# Patient Record
Sex: Male | Born: 1985 | Race: White | Hispanic: No | Marital: Single | Smoking: Never smoker
Health system: Southern US, Community
[De-identification: ages and names within clinical notes are randomized; demographics above are authoritative.]

## PROBLEM LIST (undated history)

## (undated) DIAGNOSIS — Z8489 Family history of other specified conditions: Secondary | ICD-10-CM

## (undated) DIAGNOSIS — F419 Anxiety disorder, unspecified: Secondary | ICD-10-CM

## (undated) DIAGNOSIS — G4730 Sleep apnea, unspecified: Secondary | ICD-10-CM

## (undated) DIAGNOSIS — I10 Essential (primary) hypertension: Secondary | ICD-10-CM

## (undated) HISTORY — PX: CHOLECYSTECTOMY: SHX55

---

## 2009-11-07 HISTORY — PX: LAPAROSCOPIC GASTRIC BANDING: SHX1100

## 2009-12-14 ENCOUNTER — Ambulatory Visit (HOSPITAL_COMMUNITY): Admission: RE | Admit: 2009-12-14 | Discharge: 2009-12-14 | Payer: Self-pay | Admitting: Surgery

## 2009-12-16 ENCOUNTER — Ambulatory Visit (HOSPITAL_COMMUNITY): Admission: RE | Admit: 2009-12-16 | Discharge: 2009-12-16 | Payer: Self-pay | Admitting: Surgery

## 2010-01-18 ENCOUNTER — Encounter: Admission: RE | Admit: 2010-01-18 | Discharge: 2010-04-18 | Payer: Self-pay | Admitting: Surgery

## 2010-03-08 ENCOUNTER — Ambulatory Visit (HOSPITAL_COMMUNITY): Admission: RE | Admit: 2010-03-08 | Discharge: 2010-03-09 | Payer: Self-pay | Admitting: Surgery

## 2010-04-29 ENCOUNTER — Encounter: Admission: RE | Admit: 2010-04-29 | Discharge: 2010-04-29 | Payer: Self-pay | Admitting: Surgery

## 2011-01-25 LAB — CBC
HCT: 40.9 % (ref 39.0–52.0)
HCT: 45 % (ref 39.0–52.0)
MCHC: 34.2 g/dL (ref 30.0–36.0)
MCV: 86.3 fL (ref 78.0–100.0)
Platelets: 245 10*3/uL (ref 150–400)
RBC: 4.7 MIL/uL (ref 4.22–5.81)
WBC: 11.6 10*3/uL — ABNORMAL HIGH (ref 4.0–10.5)

## 2011-01-25 LAB — DIFFERENTIAL
Basophils Absolute: 0 10*3/uL (ref 0.0–0.1)
Basophils Absolute: 0.1 10*3/uL (ref 0.0–0.1)
Basophils Relative: 1 % (ref 0–1)
Eosinophils Relative: 0 % (ref 0–5)
Lymphs Abs: 1.5 10*3/uL (ref 0.7–4.0)
Monocytes Absolute: 0.8 10*3/uL (ref 0.1–1.0)
Neutrophils Relative %: 58 % (ref 43–77)

## 2011-01-25 LAB — COMPREHENSIVE METABOLIC PANEL
ALT: 71 U/L — ABNORMAL HIGH (ref 0–53)
AST: 35 U/L (ref 0–37)
BUN: 14 mg/dL (ref 6–23)
Calcium: 9.3 mg/dL (ref 8.4–10.5)
Chloride: 103 mEq/L (ref 96–112)
GFR calc Af Amer: >60 mL/min (ref >60)
GFR calc non Af Amer: >60 mL/min (ref >60)
Potassium: 4.1 mEq/L (ref 3.5–5.1)

## 2011-03-14 ENCOUNTER — Other Ambulatory Visit (HOSPITAL_COMMUNITY): Payer: Self-pay

## 2011-03-14 ENCOUNTER — Emergency Department (HOSPITAL_COMMUNITY)
Admission: EM | Admit: 2011-03-14 | Discharge: 2011-03-14 | Disposition: A | Discharge status: Home / Self Care | Payer: BC Managed Care – PPO | Attending: Emergency Medicine | Admitting: Emergency Medicine

## 2011-03-14 ENCOUNTER — Emergency Department (HOSPITAL_COMMUNITY): Payer: BC Managed Care – PPO

## 2011-03-14 DIAGNOSIS — K8020 Calculus of gallbladder without cholecystitis without obstruction: Secondary | ICD-10-CM | POA: Insufficient documentation

## 2011-03-14 DIAGNOSIS — Z9884 Bariatric surgery status: Secondary | ICD-10-CM | POA: Insufficient documentation

## 2011-03-14 DIAGNOSIS — R748 Abnormal levels of other serum enzymes: Secondary | ICD-10-CM | POA: Insufficient documentation

## 2011-03-14 DIAGNOSIS — R109 Unspecified abdominal pain: Secondary | ICD-10-CM | POA: Insufficient documentation

## 2011-03-14 DIAGNOSIS — E669 Obesity, unspecified: Secondary | ICD-10-CM | POA: Insufficient documentation

## 2011-03-14 LAB — CBC
HCT: 44.8 % (ref 39.0–52.0)
Hemoglobin: 15.9 g/dL (ref 13.0–17.0)
MCHC: 35.5 g/dL (ref 30.0–36.0)
Platelets: 240 10*3/uL (ref 150–400)
RDW: 13.1 % (ref 11.5–15.5)

## 2011-03-14 LAB — URINALYSIS, ROUTINE W REFLEX MICROSCOPIC
Hgb urine dipstick: NEGATIVE
Protein, ur: NEGATIVE mg/dL
Urobilinogen, UA: 1 mg/dL (ref 0.0–1.0)

## 2011-03-14 LAB — LIPID PANEL
HDL: 32 mg/dL — ABNORMAL LOW (ref >39)
Total CHOL/HDL Ratio: 5.3 RATIO

## 2011-03-14 LAB — COMPREHENSIVE METABOLIC PANEL
AST: 253 U/L — ABNORMAL HIGH (ref 0–37)
Albumin: 4.2 g/dL (ref 3.5–5.2)
Calcium: 9.6 mg/dL (ref 8.4–10.5)
Creatinine, Ser: 0.85 mg/dL (ref 0.4–1.5)
GFR calc non Af Amer: >60 mL/min (ref >60)

## 2011-03-14 LAB — DIFFERENTIAL
Lymphocytes Relative: 20 % (ref 12–46)
Monocytes Absolute: 0.6 10*3/uL (ref 0.1–1.0)
Neutro Abs: 7 10*3/uL (ref 1.7–7.7)

## 2011-03-14 LAB — URINE MICROSCOPIC-ADD ON

## 2011-03-18 NOTE — Consult Note (Signed)
  NAMEDEREKE, NEUMANN                ACCOUNT NO.:  [id number]  MEDICAL RECORD NO.:  [id number]           PATIENT TYPE:  E  LOCATION:  WLED                         FACILITY:  [facility]  PHYSICIAN:  Thornton Park. Daphine Deutscher, MD  DATE OF BIRTH:  12/13/1985  DATE OF CONSULTATION: DATE OF DISCHARGE:  03/14/2011                                CONSULTATION   CHIEF COMPLAINT:  Cramping right upper quadrant abdominal pain with scleral icterus.  HISTORY:  This is a 25 year old white male who lost in excess of 150 pounds with lap band.  He looks good from that standpoint but last couple of days has developed some right upper quadrant abdominal pain. He was seen in the office by me where I removed 3 mL from his band and send him to the emergency department.  My impression he had scleral icterus with the right upper quadrant pain. It as not related to his band. It was likely his gallbladder.  In the ED, his blood pressure 152/84, pulse 75, respirations 16 and afebrile.  Gallbladder ultrasound which I ordered showed a tiny dependent shadowing calculi within the gallbladder.  No evidence of acute cholecystitis and that there was no wall thickening, pericholecystic fluid.  Common bile duct measured 4 mm so was not suggestive of distal common duct stone. White count 9800, hemoglobin 15.9.  Laboratory was abnormal and his SGOT was 253 and his total bilirubin was 5.4.  PLAN:  The plan as discussed with parents and with him is let him go home and to drink more fluids to increase his hydration.  Look for evidence to pass the stone as evidenced by his urine so concentrated and dark and stools having some color, and if this does not turn around in the next 24 hours likely the patient may need GI evaluation with ERCP and discussed timing of lap chole.  He has supposed to have an exam from our.  Currently he is feeling better and wants to go home, so we will go with that plan.  Check with our office in the  morning.  I gave him copies of his labs to share with Dr. Purnell Shoemaker tomorrow.  IMPRESSION AND PLAN:  Biliary colic from small gallstone passing of the common bile duct.     Thornton Park Daphine Deutscher, MD     MBM/MEDQ  D:  03/14/2011  T:  03/15/2011  Job:  [id number]  Electronically Signed by Luretha Murphy MD on 03/18/2011 08:36:11 AM

## 2011-04-25 IMAGING — CR DG CHEST 2V
2 series · 2 of 2 positions shown · non-contrast
Comparison: None

CLINICAL DATA: Preop for bariatric surgery

CHEST - 2 VIEW

[w chest pa]
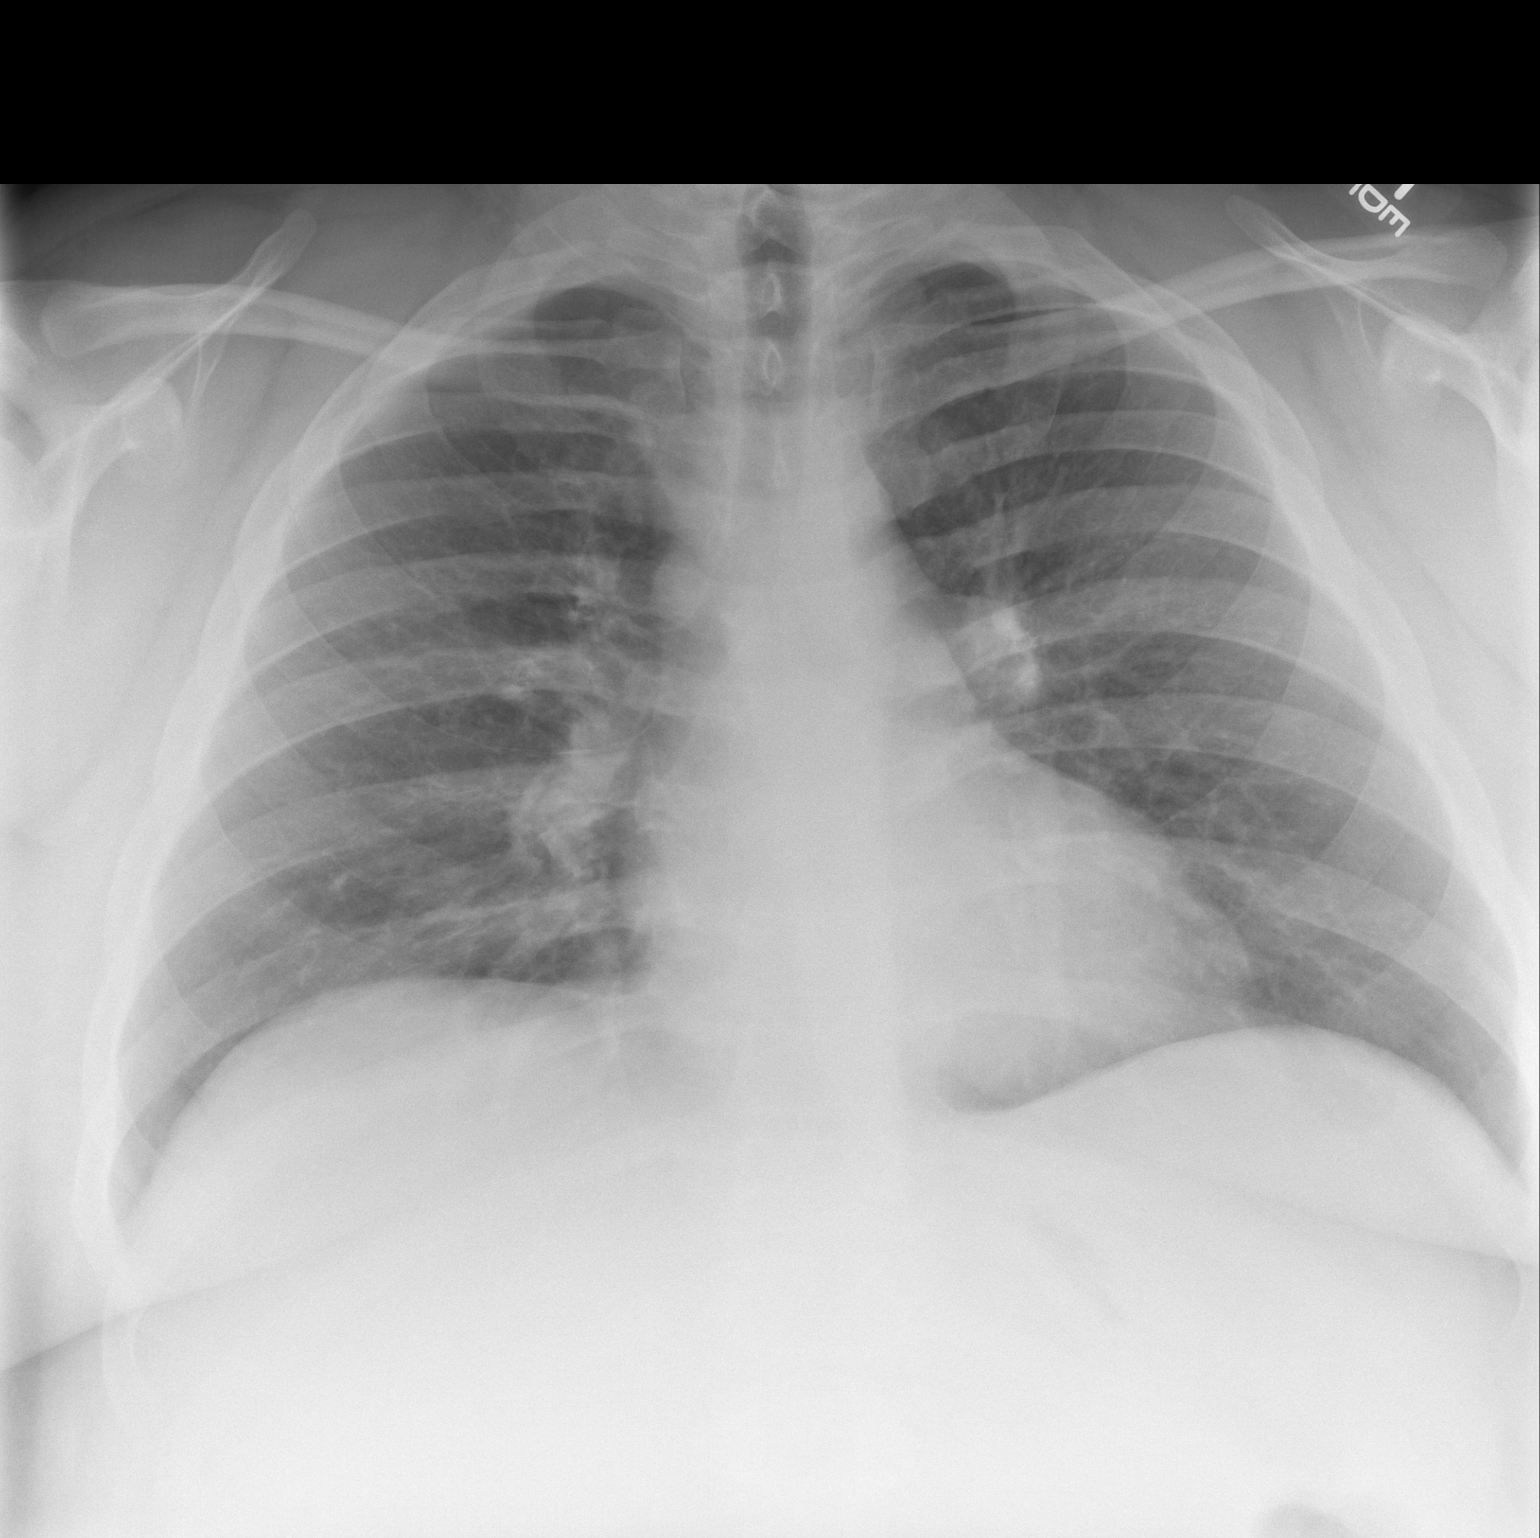

[w chest lat *]
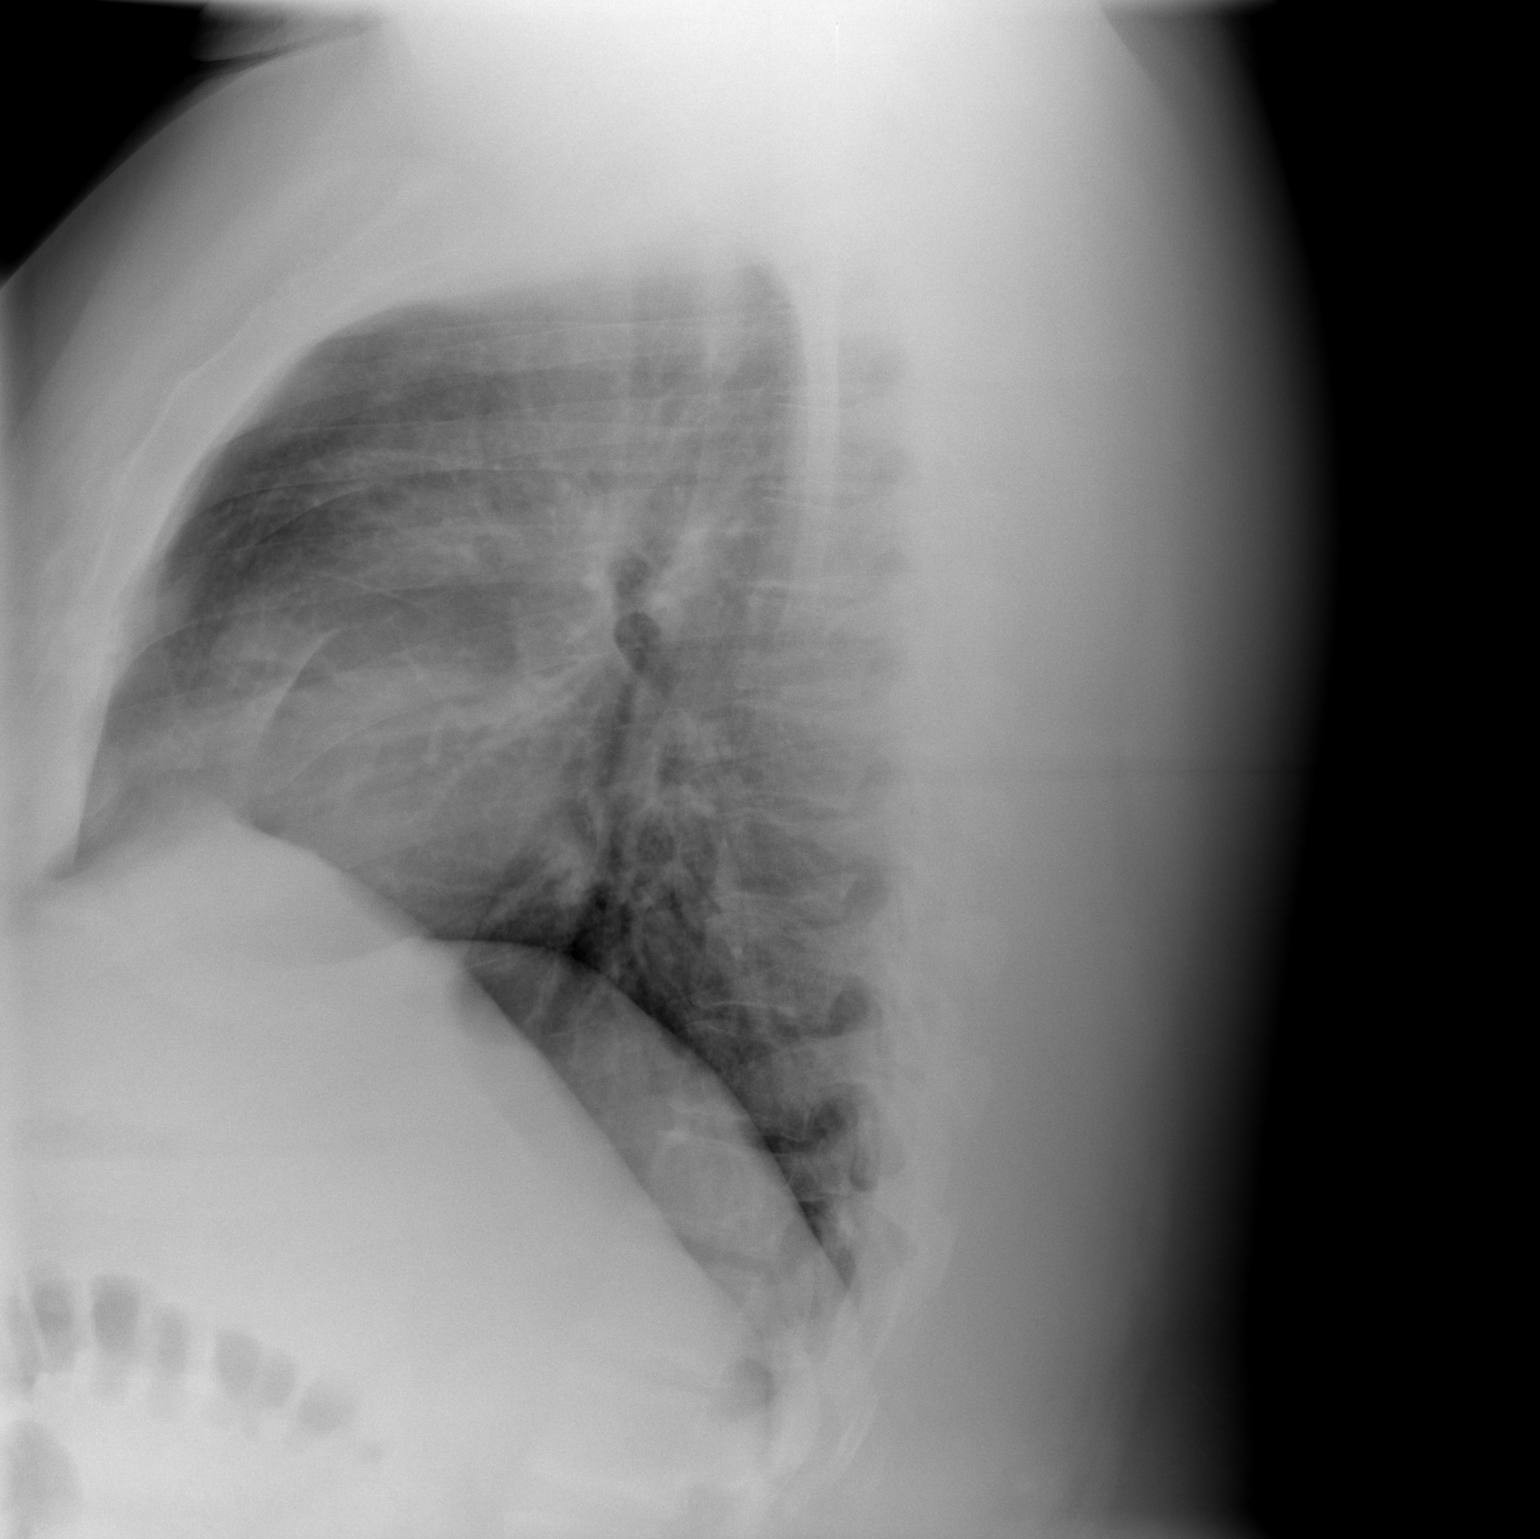

[2 of 2 positions shown; findings below may reference images not displayed]

FINDINGS: Heart and mediastinal contours normal.  Lungs clear.  No
pleural fluid or osseous lesions.
IMPRESSION: No active disease.

## 2011-05-19 ENCOUNTER — Other Ambulatory Visit (INDEPENDENT_AMBULATORY_CARE_PROVIDER_SITE_OTHER): Payer: Self-pay | Admitting: Surgery

## 2011-05-19 ENCOUNTER — Encounter (HOSPITAL_COMMUNITY): Payer: BC Managed Care – PPO

## 2011-05-25 ENCOUNTER — Other Ambulatory Visit (INDEPENDENT_AMBULATORY_CARE_PROVIDER_SITE_OTHER): Payer: Self-pay | Admitting: Surgery

## 2011-05-25 ENCOUNTER — Ambulatory Visit (HOSPITAL_COMMUNITY): Payer: BC Managed Care – PPO

## 2011-05-25 ENCOUNTER — Ambulatory Visit (HOSPITAL_COMMUNITY)
Admission: RE | Admit: 2011-05-25 | Discharge: 2011-05-26 | Disposition: A | Discharge status: Home / Self Care | Payer: BC Managed Care – PPO | Source: Ambulatory Visit | Attending: Surgery | Admitting: Surgery

## 2011-05-25 DIAGNOSIS — Z01812 Encounter for preprocedural laboratory examination: Secondary | ICD-10-CM | POA: Insufficient documentation

## 2011-05-25 DIAGNOSIS — K8010 Calculus of gallbladder with chronic cholecystitis without obstruction: Secondary | ICD-10-CM

## 2011-05-26 LAB — COMPREHENSIVE METABOLIC PANEL
ALT: 69 U/L — ABNORMAL HIGH (ref 0–53)
AST: 29 U/L (ref 0–37)
Alkaline Phosphatase: 63 U/L (ref 39–117)
Calcium: 8.9 mg/dL (ref 8.4–10.5)
Creatinine, Ser: 0.76 mg/dL (ref 0.50–1.35)
Potassium: 3.5 mEq/L (ref 3.5–5.1)
Total Bilirubin: 0.7 mg/dL (ref 0.3–1.2)
Total Protein: 6.5 g/dL (ref 6.0–8.3)

## 2011-05-26 LAB — CBC
HCT: 42.4 % (ref 39.0–52.0)
MCH: 29.5 pg (ref 26.0–34.0)
MCHC: 34.2 g/dL (ref 30.0–36.0)
MCV: 86.2 fL (ref 78.0–100.0)
WBC: 13.6 10*3/uL — ABNORMAL HIGH (ref 4.0–10.5)

## 2011-05-26 NOTE — Op Note (Signed)
  NAMEJAIRON, RIPBERGER NO.:  [id number]  MEDICAL RECORD NO.:  [id number]  LOCATION:  DAYL                         FACILITY:  [facility]  PHYSICIAN:  Thornton Park. Daphine Deutscher, MD  DATE OF BIRTH:  [birth year]  DATE OF PROCEDURE:  05/25/2011 DATE OF DISCHARGE:                              OPERATIVE REPORT   PREOPERATIVE DIAGNOSIS:  Chronic cholecystitis with symptoms, suggestive of choledocholithiasis.  PROCEDURE:  Lap cholecystectomy.  FINDINGS:  Cystic duct obstruction, presumably with stones and inflammatory action.  SURGEON:  Thornton Park. Daphine Deutscher, MD.  ASSISTANT:  Ollen Gross. Vernell Morgans, M.D.  ANESTHESIA:  General endotracheal.  DESCRIPTION OF PROCEDURE:  [place]ale Marney was taken to room on Wednesday, May 25, 2011, given general anesthesia.  He is about 14 months out from a lap band and has done well and lost over 100 pounds.  In the last several weeks, he has developed biliary colic-type symptoms, had small gallstones, seen on an ultrasound.  After he was placed asleep, the abdomen was prepped with PC max and draped sterilely.  I entered the abdomen without difficulty through the right upper quadrant using a 5-mm 0-degrees OptiVu technique.  Once the abdomen was insufflated, a five was placed below the umbilicus and 11 was placed in the upper abdomen, another five was placed laterally.  The gallbladder was readily visualized, it was large.  We went ahead and began mobilization, the neck was very stuck with omentum and fat.  This was peeled away and I did a dissection into the funnel of the gallbladder, I am heading toward, appeared to be a cystic duct.  It was broad-based.  I dissected free of vessels and put a clip upon the gallbladder and incised this, and then inserted a Reddick catheter.  I took a dynamic cholangiogram, I am initially thinking that it had become dislodged because it was filled and then extravasated.  As a layer, reviewed these and again after I  repeated it, looked like I was filling the final of the distal infundibulum or cystic duct junction.  The cystic duct appeared to probably be intrahepatic with a common or antrum, common duct showed common wall because further dissection was appeared to be hazardous.  I elected at that point to, I probed it with the Reddick catheter, was unable to dislodge any stones and elected to double-ligated with PDS tying, lap were tied.  The gallbladder was then removed, triply clipped the cystic artery, and then placed the gallbladder in bag, and brought out through the upper port.  Inspection of the gallbladder planned, no bleeding or bile leaks were noted.  Port sites were all injected with Exparel and then were closed with 4-0 Vicryl.  Benzoin, Steri-Strips applied.  The patient tolerated the procedure well, was taken to recovery room in satisfactory condition.     Thornton Park Daphine Deutscher, MD     MBM/MEDQ  D:  05/25/2011  T:  05/25/2011  Job:  [id number]  Electronically Signed by Luretha Murphy MD on 05/26/2011 08:46:56 AM

## 2011-05-29 NOTE — Discharge Summary (Signed)
  NAMEMERWYN, HODAPP NO.:  [id number]  MEDICAL RECORD NO.:  [id number]  LOCATION:  1536                         FACILITY:  [facility]  PHYSICIAN:  Thornton Park. Daphine Deutscher, MD  DATE OF BIRTH:  Nov 08, 1985  DATE OF ADMISSION:  05/25/2011 DATE OF DISCHARGE:  05/26/2011                              DISCHARGE SUMMARY   ADMITTING DIAGNOSIS:  Chronic cholecystitis.  DISCHARGE DIAGNOSIS:  Chronic cholecystitis.  COURSE IN HOSPITAL:  This 25 year old white male 1 year out from lap band losing about 100 pounds developed symptomatic gallstones.  These were impacted in the cystic duct.  This precluded the completion intraoperative cholangiogram.  He had a significant inflammatory reaction around his infundibulum of his gallbladder explaining his pain. He was doing well and ready for discharge on postop day #1.  Labs were all considered appropriate and normal considering surgery. Followup in our office in 2 weeks.  CONDITION:  Improved.     Thornton Park Daphine Deutscher, MD     MBM/MEDQ  D:  05/26/2011  T:  05/26/2011  Job:  [id number]  Electronically Signed by Luretha Murphy MD on 05/29/2011 10:43:17 PM

## 2011-05-30 ENCOUNTER — Telehealth (INDEPENDENT_AMBULATORY_CARE_PROVIDER_SITE_OTHER): Payer: Self-pay | Admitting: General Surgery

## 2011-05-30 ENCOUNTER — Other Ambulatory Visit (INDEPENDENT_AMBULATORY_CARE_PROVIDER_SITE_OTHER): Payer: Self-pay | Admitting: Surgery

## 2011-05-30 LAB — CBC WITH DIFFERENTIAL/PLATELET
Hemoglobin: 16 g/dL (ref 13.0–17.0)
Lymphocytes Relative: 28 % (ref 12–46)
Lymphs Abs: 3.1 10*3/uL (ref 0.7–4.0)
MCH: 31.4 pg (ref 26.0–34.0)
MCHC: 35.6 g/dL (ref 30.0–36.0)
Monocytes Relative: 4 % (ref 3–12)
Platelets: 265 10*3/uL (ref 150–400)
RBC: 5.09 MIL/uL (ref 4.22–5.81)
RDW: 12.5 % (ref 11.5–15.5)

## 2011-05-30 LAB — HEPATIC FUNCTION PANEL
ALT: 53 U/L (ref 0–53)
Alkaline Phosphatase: 59 U/L (ref 39–117)
Bilirubin, Direct: 0.2 mg/dL (ref 0.0–0.3)
Total Bilirubin: 0.9 mg/dL (ref 0.3–1.2)

## 2011-05-30 LAB — COMPREHENSIVE METABOLIC PANEL
ALT: 53 U/L (ref 0–53)
AST: 27 U/L (ref 0–37)
Albumin: 4.2 g/dL (ref 3.5–5.2)
Alkaline Phosphatase: 60 U/L (ref 39–117)
Calcium: 9.5 mg/dL (ref 8.4–10.5)
Total Bilirubin: 0.9 mg/dL (ref 0.3–1.2)

## 2011-05-30 NOTE — Telephone Encounter (Signed)
PT'S MOTHER CALLED TO REPORT THAT ZEB DOES NOT FEEL WELL/ NAUSEA, DIARRHEA 2-3 TIMES A DAY AND COUGH. NO FEVER AND PAIN IS BETTER COMPARED TO 2-3 DAYS AGO. PAIN MED IS CONTROLLING/ DR. MARTIN NOTIFIED AND ORDERED LABS TO BE DONE TODAY/ STAT/ LIVER PROFILE, CMET AND CBC WITH DIFF. PT'S MOTHER NOTIFIED.

## 2011-05-30 NOTE — Telephone Encounter (Signed)
CBC and CMP ok per [employer].  Pt feeling better per RN Dellie Catholic).  Will f/u soon.

## 2011-05-31 ENCOUNTER — Telehealth (INDEPENDENT_AMBULATORY_CARE_PROVIDER_SITE_OTHER): Payer: Self-pay | Admitting: Surgery

## 2011-06-15 ENCOUNTER — Encounter (INDEPENDENT_AMBULATORY_CARE_PROVIDER_SITE_OTHER): Payer: Self-pay | Admitting: General Surgery

## 2011-06-16 ENCOUNTER — Ambulatory Visit (INDEPENDENT_AMBULATORY_CARE_PROVIDER_SITE_OTHER): Payer: BC Managed Care – PPO | Admitting: Surgery

## 2011-06-16 ENCOUNTER — Encounter (INDEPENDENT_AMBULATORY_CARE_PROVIDER_SITE_OTHER): Payer: Self-pay | Admitting: Surgery

## 2011-06-16 VITALS — Temp 97.2°F | Wt 313.4 lb

## 2011-06-16 DIAGNOSIS — Z9049 Acquired absence of other specified parts of digestive tract: Secondary | ICD-10-CM

## 2011-06-16 DIAGNOSIS — Z9889 Other specified postprocedural states: Secondary | ICD-10-CM

## 2011-06-16 NOTE — Progress Notes (Signed)
Manuel Wood comes in today after his laparoscopic cholecystectomy. His incisions have healed nicely. Last 3 days he has had some form GI bug which resolved yesterday. His weight is down to 313 which is what it was back when I took fluid from his band in May.  I will see him again in 6 weeks in followup and consider band fill at that time.

## 2011-06-19 ENCOUNTER — Emergency Department (HOSPITAL_COMMUNITY): Payer: BC Managed Care – PPO

## 2011-06-19 ENCOUNTER — Inpatient Hospital Stay (HOSPITAL_COMMUNITY)
Admission: EM | Admit: 2011-06-19 | Discharge: 2011-06-21 | DRG: 208 | Disposition: A | Discharge status: Home / Self Care | Payer: BC Managed Care – PPO | Attending: Surgery | Admitting: Surgery

## 2011-06-19 DIAGNOSIS — Z9884 Bariatric surgery status: Secondary | ICD-10-CM

## 2011-06-19 DIAGNOSIS — R112 Nausea with vomiting, unspecified: Secondary | ICD-10-CM | POA: Diagnosis present

## 2011-06-19 DIAGNOSIS — K838 Other specified diseases of biliary tract: Principal | ICD-10-CM | POA: Diagnosis present

## 2011-06-19 DIAGNOSIS — E669 Obesity, unspecified: Secondary | ICD-10-CM | POA: Diagnosis present

## 2011-06-19 LAB — COMPREHENSIVE METABOLIC PANEL
ALT: 416 U/L — ABNORMAL HIGH (ref 0–53)
AST: 303 U/L — ABNORMAL HIGH (ref 0–37)
Alkaline Phosphatase: 152 U/L — ABNORMAL HIGH (ref 39–117)
CO2: 30 mEq/L (ref 19–32)
Calcium: 10.1 mg/dL (ref 8.4–10.5)
Potassium: 3.6 mEq/L (ref 3.5–5.1)
Sodium: 137 mEq/L (ref 135–145)
Total Protein: 8.2 g/dL (ref 6.0–8.3)

## 2011-06-19 LAB — URINALYSIS, ROUTINE W REFLEX MICROSCOPIC
Glucose, UA: NEGATIVE mg/dL
Hgb urine dipstick: NEGATIVE
Protein, ur: NEGATIVE mg/dL
Urobilinogen, UA: 1 mg/dL (ref 0.0–1.0)

## 2011-06-19 LAB — CBC
HCT: 47.7 % (ref 39.0–52.0)
Hemoglobin: 16.8 g/dL (ref 13.0–17.0)
MCHC: 35.2 g/dL (ref 30.0–36.0)
MCV: 85.3 fL (ref 78.0–100.0)
RDW: 12.9 % (ref 11.5–15.5)

## 2011-06-19 LAB — URINE MICROSCOPIC-ADD ON

## 2011-06-19 LAB — DIFFERENTIAL
Basophils Absolute: 0 10*3/uL (ref 0.0–0.1)
Eosinophils Relative: 2 % (ref 0–5)
Lymphocytes Relative: 23 % (ref 12–46)
Lymphs Abs: 2.3 10*3/uL (ref 0.7–4.0)
Monocytes Absolute: 0.8 10*3/uL (ref 0.1–1.0)
Monocytes Relative: 8 % (ref 3–12)
Neutro Abs: 6.7 10*3/uL (ref 1.7–7.7)

## 2011-06-20 LAB — CBC
Hemoglobin: 14.5 g/dL (ref 13.0–17.0)
MCH: 29.5 pg (ref 26.0–34.0)
MCHC: 34.5 g/dL (ref 30.0–36.0)
MCV: 85.5 fL (ref 78.0–100.0)
RBC: 4.91 MIL/uL (ref 4.22–5.81)

## 2011-06-20 LAB — LIPASE, BLOOD: Lipase: 17 U/L (ref 11–59)

## 2011-06-20 LAB — COMPREHENSIVE METABOLIC PANEL
BUN: 9 mg/dL (ref 6–23)
CO2: 28 mEq/L (ref 19–32)
Calcium: 9 mg/dL (ref 8.4–10.5)
Creatinine, Ser: 0.81 mg/dL (ref 0.50–1.35)
GFR calc Af Amer: >60 mL/min (ref >60)
GFR calc non Af Amer: >60 mL/min (ref >60)
Glucose, Bld: 112 mg/dL — ABNORMAL HIGH (ref 70–99)

## 2011-06-21 ENCOUNTER — Inpatient Hospital Stay (HOSPITAL_COMMUNITY): Payer: BC Managed Care – PPO

## 2011-06-21 LAB — COMPREHENSIVE METABOLIC PANEL
Albumin: 3.2 g/dL — ABNORMAL LOW (ref 3.5–5.2)
Alkaline Phosphatase: 120 U/L — ABNORMAL HIGH (ref 39–117)
BUN: 5 mg/dL — ABNORMAL LOW (ref 6–23)
Potassium: 3.9 mEq/L (ref 3.5–5.1)
Sodium: 139 mEq/L (ref 135–145)
Total Protein: 6.3 g/dL (ref 6.0–8.3)

## 2011-06-21 LAB — PROTIME-INR
INR: 1.04 (ref 0.00–1.49)
Prothrombin Time: 13.8 seconds (ref 11.6–15.2)

## 2011-06-21 LAB — CBC
MCH: 30.5 pg (ref 26.0–34.0)
Platelets: 216 10*3/uL (ref 150–400)
RBC: 4.65 MIL/uL (ref 4.22–5.81)
WBC: 9.4 10*3/uL (ref 4.0–10.5)

## 2011-06-21 LAB — TYPE AND SCREEN
ABO/RH(D): O POS
Antibody Screen: NEGATIVE

## 2011-06-21 LAB — ABO/RH: ABO/RH(D): O POS

## 2011-06-21 NOTE — Consult Note (Signed)
Manuel Wood, Manuel Wood NO.:  [id number]  MEDICAL RECORD NO.:  [id number]  LOCATION:  1529                         FACILITY:  [facility]  PHYSICIAN:  Bernette Redbird, M.D.   DATE OF BIRTH:  04/30/1986  DATE OF CONSULTATION: DATE OF DISCHARGE:                                CONSULTATION   Dr. Johna Sheriff of the surgical service, covering for Dr. Luretha Murphy, asked Manuel Wood to see this 25 year old gentleman because of probable choledocholithiasis.  Manuel Wood is approximately 1 month status post laparoscopic cholecystectomy, at which time his liver chemistries were essentially normal with just an elevation of ALT  to 69, a 4 mm common duct, but inability to enter the cystic duct during attempted intraoperative cholangiography, raising the question of some impacted stones there. The patient did well until about a week ago when he began having pain reminiscent of his gallbladder attack with abdominal pain, nausea, vomiting, headache, and pain going through to his lower back.  He did not have fevers or chills, but did develop dark urine.  He presented to the emergency room last night, where his white count was normal, and lipase was normal, but there had been a significant interim rise in his liver chemistries, with transaminases in the 300-400 range and a bilirubin of 3.6.  He was admitted with a presumptive diagnosis of choledocholithiasis.  The patient has been in good general health apart from significant obesity, for which he underwent Lap-Band surgery about a year and half ago, with a loss of about 100 pounds in weight.  The Lap-Band was loosened last night by removing fluid from the reservoir in anticipation of the need for ERCP.  That was done last night in the emergency room by Dr. Johna Sheriff.  At this time, the patient is fairly comfortable, and his vital signs have been unremarkable in the hospital.  PAST MEDICAL HISTORY:  No known allergies.  OUTPATIENT  MEDICATIONS:  None.  PRIOR OPERATIONS:  Per HPI, Lap-Band and laparoscopic cholecystectomy.  CHRONIC MEDICAL ILLNESSES:  None apart from the above-mentioned significant obesity.  Specifically, no coronary disease, hypertension, diabetes, asthma, or ulcers.  HABITS:  Nonsmoker, nondrinker.  FAMILY HISTORY:  Not obtained.  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  REVIEW OF SYSTEMS:  See HPI.  PHYSICAL EXAMINATION:  GENERAL:  This is an extremely pleasant, articulate, Caucasian male in absolutely no distress, lying in bed, but sitting up easily. HEENT:  He is anicteric and without pallor. SKIN:  Warm and dry. CHEST:  Clear. HEART:  Without gallops, rubs, or murmurs appreciated. ABDOMEN:  Still substantially adipose, but without tenderness to palpation.  LABORATORY DATA:  White count 7700, hemoglobin 14.5, and platelets 225,000.  Chemistry panel shows normal renal function, glucose of 112. Liver chemistries have risen slightly from last night.  Bilirubin has gone from 3.6 to 4.3, AST from 303 to 342, and ALT from 416 to 444. There has been a slight drop in his alk phos from 152 to 126.  Lipase is normal at 17.  Urinalysis shows moderate bilirubin.  IMPRESSION:  The clinical picture strongly suggestive of choledocholithiasis without complications such as cholangitis or pancreatitis.  He is symptomatic consistent with common duct obstruction, with both pain and elevation of liver chemistries.  RECOMMENDATIONS:  We agree with the impression of choledocholithiasis and feel that the patient should undergo ERCP.  This has been arranged for 7:30 tomorrow morning under anesthesia.  We feel that anesthesia rather than standard IV conscious sedation would be better given this patient's young age and large body size.  I have reviewed the nature, purpose,  risks, and alternatives of ERCP, sphincterotomy, stone extraction, and stent placement with the patient and his mother, and they are desirous of proceeding.  As noted, the patient's Lap-Band has been loosened already, so there should be no impediment to endoscopic evaluation for the ERCP.  I have discussed the case with my partner, Dr. Vida Rigger, who will be the one most likely to do the procedure in the morning, and he and I agree that further imaging studies at this time such as an updated ultrasound would not likely add anything to the patient's care, so we will not pursue them at this point.  We certainly appreciate the opportunity to have seen this very pleasant patient in consultation with you.          ______________________________ Bernette Redbird, M.D.     RB/MEDQ  D:  06/20/2011  T:  06/20/2011  Job:  [id number]  cc:   Thornton Park Daphine Deutscher, MD [address], [address] [place] [address]  Electronically Signed by Bernette Redbird M.D. on 06/21/2011 10:58:26 AM

## 2011-07-04 NOTE — H&P (Signed)
Manuel Wood, Manuel Wood NO.:  [id number]  MEDICAL RECORD NO.:  [id number]  LOCATION:  WLED                         FACILITY:  [facility]  PHYSICIAN:  Sharlet Salina T. Juni Glaab, M.D.DATE OF BIRTH:  13-Aug-1986  DATE OF ADMISSION:  06/19/2011 DATE OF DISCHARGE:                             HISTORY & PHYSICAL   CHIEF COMPLAINT:  Nausea, vomiting, abdominal pain.  HISTORY:  Manuel Wood is a 25 year old male with a significant history of lap band placement about one and a half years ago by Dr. Daphine Deutscher over 100 pound weight loss.  He developed abdominal pain, was found to have gallstones, and underwent laparoscopic cholecystectomy by Dr. Daphine Deutscher on May 25, 2011.  Cholangiogram was not technically possible and there was concern about a stone at the cystic duct, common duct junction.  The patient, however, did well postoperatively.  He did have 1 episode of recurrent abdominal pain in his right upper quadrant a week or two postop and lab work was unremarkable and this resolved.  The patient now, however, presents to [facility] this weekend with 3 days of persistent epigastric abdominal pain, aching in character, and also pain in his midback.  This has been associated with nausea and vomiting of anything he has tried to eat.  The pain is moderately severe.  It is better after medication here in the emergency room.  It is also noted that his urine has been dark.  No fever or chills.  He does not really have symptoms of obstruction secondary to his band.  PAST MEDICAL HISTORY:  Other than above is entirely unremarkable.  MEDICATIONS:  None.  ALLERGIES:  None.  SOCIAL HISTORY:  Works as a Economist.  Does not smoke cigarettes or drink alcohol or use drugs.  FAMILY HISTORY:  Noncontributory.  REVIEW OF SYSTEMS:  GENERAL:  No fever, chills.  HEENT:  No vision, hearing, swallowing problems.  RESPIRATORY:  No shortness of breath, cough, wheezing.   CARDIAC:  No chest pain, palpitations, history heart disease.  GI:  Above.  GU:  No urinary burning, frequency, or blood.  PHYSICAL EXAMINATION:  VITAL SIGNS:  Temperature is 98, heart rate 20, blood pressure is 161/99, heart rate is 80, respirations 20, O2 saturation 100%.  GENERAL:  Overweight white male in no acute distress. SKIN:  Warm and dry.  No rash or infection. HEENT:  Scleral icterus not appreciable.  Pupils equal, round, and reactive.  Oropharynx clear. LYMPH NODES:  No cervical, supraclavicular, or inguinal nodes palpable. LUNGS:  Clear without wheezing or increased work of breathing. CARDIAC:  Regular rate and rhythm.  No murmurs.  No edema. ABDOMEN:  Mild epigastric tenderness.  Port site and incisions look fine. EXTREMITIES:  No joint swelling, deformity. NEUROLOGIC:  Alert, fully oriented.  LABORATORY DATA AND X-RAYS:  White count 10,000, hemoglobin 16.8. Urinalysis shows moderate bilirubin, lipase is 30.  Electrolytes, renal function normal.  LFTs show SGOT 303, SGPT 416, alkaline phosphatase 152, and bilirubin 3.6.  KUB shows his band in good position.  ASSESSMENT:  3 days of nausea, vomiting, abdominal pain, and elevated LFTs.  All consistent with common bile duct stone following laparoscopic  cholecystectomy.  This does not sound like over restriction from his band.  PLAN:  The patient is being admitted.  We will cover with antibiotics for the possibility of early cholangitis.  As precaution, all fluid was removed from his band, which was 3 cc, although again I do not believe this was obstructed.  This may will help with endoscopy as well.  I have spoke with Dr. Bosie Clos from [facility] and they will consult regarding possible ERCP.  We will recheck LFTs in the morning.     [name]. [name], M.D.     [name]  D:  06/19/2011  T:  06/20/2011  Job:  [id number]  Electronically Signed by Glenna Fellows M.D. on 07/04/2011 11:22:49 AM

## 2011-07-20 NOTE — Discharge Summary (Signed)
  NAMENORVILLE, DANI NO.:  [id number]  MEDICAL RECORD NO.:  [id number]  LOCATION:  1529                         FACILITY:  [facility]  PHYSICIAN:  Thornton Park. Daphine Deutscher, MD  DATE OF BIRTH:  [birth year]  DATE OF ADMISSION:  06/19/2011 DATE OF DISCHARGE:  06/21/2011                              DISCHARGE SUMMARY   ADMITTING DIAGNOSES:  Abdominal pain, nausea, and vomiting; probable common duct stone.  DISCHARGE DIAGNOSES:  Abdominal pain, nausea, and vomiting; probable common duct stone.  COURSE IN HOSPITAL:  The patient was admitted to my service by Dr. Johna Sheriff for a post Lap Chole pain and some elevation of LFTs.  GI consultation was obtained for probable ERCP.  The patient was seen by Dr. Matthias Hughs on August 13 and following that he went for an ERCP that was successful in extracting his obstruction.  He was ready for discharge on August 14 as he was doing well post ERCP. Arrangements were made for followup in the office with me in a couple weeks.  FINAL DIAGNOSIS:  Passage of common duct stones, status post endoscopic retrograde cholangiopancreatography.     Thornton Park Daphine Deutscher, MD     MBM/MEDQ  D:  07/14/2011  T:  07/14/2011  Job:  [id number]  Electronically Signed by Luretha Murphy MD on 07/20/2011 07:38:04 AM

## 2011-07-27 NOTE — Op Note (Signed)
  [name], Wood NO.:  [id number]  MEDICAL RECORD NO.:  [id number]  LOCATION:  1529                         FACILITY:  [facility]  PHYSICIAN:  Petra Kuba, M.D.    DATE OF BIRTH:  08-Sep-1986  DATE OF PROCEDURE:  06/21/2011 DATE OF DISCHARGE:  06/21/2011                              OPERATIVE REPORT   PROCEDURE:  Endoscopic retrograde cholangiopancreatography, sphincterotomy, and balloon pull-through.  INDICATIONS:  Patient with probable CBD stones, status post laparoscopic cholecystectomy with increased liver tests and recurrent pain.  Consent was signed after risks, benefits, methods, options were thoroughly discussed by both myself and my partner, Bernette Redbird, with both the patient and his family.  MEDICINES USED:  Per general anesthesia.  PROCEDURE:  The side-viewing therapeutic video duodenoscope was inserted by indirect vision into the stomach and advanced into the duodenum without difficulty.  A rather small ampulla was brought into view. Using triple-lumen sphincterotome loaded with a Jagwire, initially a few wire advancements were towards the pancreas but no dye was injected into the pancreas.  The sphincterotome was repositioned and deep selective cannulation was obtained.  The CBD was normal as was the distal intrahepatic.  No obvious stone was seen.  We proceeded with a medium- sized sphincterotomy until we had adequate biliary drainage and we could get the fully bowed sphincterotome easily in and out of the duct.  We exchanged the sphincterotome for the 12 to 15 mm adjustable balloon and proceeded with three balloon pull throughs.  Some flecks of stone debris were removed but no frank stone and no abnormalities were seen under fluoro.  The balloon passed fairly readily through the patent sphincterotomy site, inflated to roughly 12 mm.  We then proceeded with an occlusion cholangiogram which was normal.  The balloon was pulled through the  patent sphincterotomy site one more time.  There was adequate biliary drainage.  We elected to stop the procedure at this juncture.  The wire was removed as was the balloon and the scope removed.  The patient tolerated the procedure well.  There was no obvious immediate complication.  ENDOSCOPIC DIAGNOSES: 1. Small ampulla. 2. Few PD wire advancements but no dye injected into the pancreas. 3. Normal common bile duct and intrahepatic without obvious stones     seen. 4. Medium-sized sphincterotomy and three 12 to 15 mm adjustable     balloon pull throughs with flecks of stone being delivered. 5. Negative occlusion cholangiogram. 6. Adequate drainage.  PLAN:  Observe for delayed complications.  If none, we will slowly advance his diet, and hopefully home later today after his evening meal or  tomorrow.  And we will need to repeat liver tests either tomorrow or as an outpatient to make sure return to normal, and happy to see him back sooner p.r.n.          ______________________________ Petra Kuba, M.D.     MEM/MEDQ  D:  06/21/2011  T:  06/21/2011  Job:  [id number]  cc:   Thornton Park. Daphine Deutscher, MD [address] [place] [address]  Electronically Signed by Vida Rigger M.D. on 07/27/2011 02:52:07 PM

## 2011-07-29 ENCOUNTER — Encounter (INDEPENDENT_AMBULATORY_CARE_PROVIDER_SITE_OTHER): Payer: BC Managed Care – PPO | Admitting: Surgery

## 2011-10-20 ENCOUNTER — Encounter (INDEPENDENT_AMBULATORY_CARE_PROVIDER_SITE_OTHER): Payer: Self-pay | Admitting: Surgery

## 2011-10-20 ENCOUNTER — Ambulatory Visit (INDEPENDENT_AMBULATORY_CARE_PROVIDER_SITE_OTHER): Payer: BC Managed Care – PPO | Admitting: Surgery

## 2011-10-20 VITALS — BP 132/78 | HR 68 | Temp 97.8°F | Resp 18 | Ht 72.0 in | Wt 334.0 lb

## 2011-10-20 DIAGNOSIS — Z9884 Bariatric surgery status: Secondary | ICD-10-CM

## 2011-10-20 DIAGNOSIS — Z4651 Encounter for fitting and adjustment of gastric lap band: Secondary | ICD-10-CM

## 2011-10-20 NOTE — Patient Instructions (Signed)

## 2011-10-20 NOTE — Progress Notes (Signed)
2 cc were added band today.  Manuel Wood is going to split it would have to the anvil. He knows to stay on liquids for couple days. His weight is gone up to 334 since we had to take some fluid when we do his cholecystectomy. I hadn't seen him since that time. I will get in back in 6 weeks to adjust his band if necessary.

## 2011-12-02 ENCOUNTER — Encounter (INDEPENDENT_AMBULATORY_CARE_PROVIDER_SITE_OTHER): Payer: BC Managed Care – PPO

## 2011-12-05 ENCOUNTER — Encounter (INDEPENDENT_AMBULATORY_CARE_PROVIDER_SITE_OTHER): Payer: Self-pay | Admitting: General Surgery

## 2011-12-05 ENCOUNTER — Ambulatory Visit (INDEPENDENT_AMBULATORY_CARE_PROVIDER_SITE_OTHER): Payer: BC Managed Care – PPO | Admitting: General Surgery

## 2011-12-05 VITALS — BP 132/86 | HR 80 | Temp 97.0°F | Resp 20 | Ht 72.0 in | Wt 331.4 lb

## 2011-12-05 DIAGNOSIS — E669 Obesity, unspecified: Secondary | ICD-10-CM

## 2011-12-05 DIAGNOSIS — Z4651 Encounter for fitting and adjustment of gastric lap band: Secondary | ICD-10-CM

## 2011-12-05 NOTE — Progress Notes (Signed)
Subjective:     Patient ID: Manuel Wood, male   DOB: Apr 04, 1986, 26 y.o.   MRN: [id number]  HPI This patient is 20 years status post lap band placement that he had gallbladder surgery this summer and had 3 cc taken out of his band for his procedure. He has had 2 cc added back but still is fighting the snacking in the hunger. He denies any reflux, cough, vomiting, or regurgitation. He is working out at gym with both cardio and weight training.  Review of Systems     Objective:   Physical Exam Incisions are well-healed without sign of infection no evidence of port site problems    Assessment:     Morbid obesity with a BMI of 45 He is hungry and has had minimal weight loss since his last visit. Access has been on the first attempt and add back 1 cc to equal a total of 3 cc that was taken out for his surgery. There was no bounce back in the plunger    Plan:     I recommended 2 days liquids 2 days of soft foods and increase his diet as tolerated and he will follow up in 4 weeks for a repeat evaluation with Dr. Daphine Deutscher.

## 2012-01-20 ENCOUNTER — Encounter (INDEPENDENT_AMBULATORY_CARE_PROVIDER_SITE_OTHER): Payer: BC Managed Care – PPO | Admitting: Surgery

## 2012-04-06 ENCOUNTER — Encounter (INDEPENDENT_AMBULATORY_CARE_PROVIDER_SITE_OTHER): Payer: BC Managed Care – PPO | Admitting: Surgery

## 2012-04-26 ENCOUNTER — Ambulatory Visit (INDEPENDENT_AMBULATORY_CARE_PROVIDER_SITE_OTHER): Payer: BC Managed Care – PPO | Admitting: Physician Assistant

## 2012-04-26 ENCOUNTER — Encounter (INDEPENDENT_AMBULATORY_CARE_PROVIDER_SITE_OTHER): Payer: Self-pay

## 2012-04-26 VITALS — BP 132/84 | Ht 72.0 in | Wt >= 6400 oz

## 2012-04-26 DIAGNOSIS — Z4651 Encounter for fitting and adjustment of gastric lap band: Secondary | ICD-10-CM

## 2012-04-26 NOTE — Patient Instructions (Signed)
Take clear liquids tonight. Thin protein shakes are ok to start tomorrow morning. Slowly advance your diet thereafter. Call us if you have persistent vomiting or regurgitation, night cough or reflux symptoms. Return as scheduled or sooner if you notice no changes in hunger/portion sizes.  

## 2012-04-26 NOTE — Progress Notes (Signed)
  HISTORY: Manuel Wood is a 26 y.o.male who received an AP-Large lap-band in May 2011 by Dr. Daphine Deutscher. He comes in with increasing hunger and portion sizes. He denies persistent regurgitation or reflux.  VITAL SIGNS: Filed Vitals:   04/26/12 1121  BP: 132/84    PHYSICAL EXAM: Physical exam reveals a very well-appearing 25 y.o.male in no apparent distress Neurologic: Awake, alert, oriented Psych: Bright affect, conversant Respiratory: Breathing even and unlabored. No stridor or wheezing Abdomen: Soft, nontender, nondistended to palpation. Incisions well-healed. No incisional hernias. Port easily palpated. Extremities: Atraumatic, good range of motion.  ASSESMENT: 26 y.o.  male  s/p AP-Large lap-band.   PLAN: The patient's port was accessed with a 20G Huber needle without difficulty. Clear fluid was aspirated and 1 mL saline was added to the port. The patient was able to swallow water without difficulty following the procedure and was instructed to take clear liquids for the next 24-48 hours and advance slowly as tolerated.

## 2012-07-26 ENCOUNTER — Encounter (INDEPENDENT_AMBULATORY_CARE_PROVIDER_SITE_OTHER): Payer: BC Managed Care – PPO

## 2012-10-03 IMAGING — RF DG CHOLANGIOGRAM OPERATIVE
1 series · 12 of 12 positions shown · non-contrast
Comparison: None.

CLINICAL DATA: Gallstones.  Cholecystectomy.

INTRAOPERATIVE CHOLANGIOGRAM
TECHNIQUE: Cholangiographic images from the C-arm fluoroscopic
device were submitted for interpretation post-operatively.  Please
see the procedural report for the amount of contrast and the
fluoroscopy time utilized.

[Series 1: run · 3 acquisitions, 12 frames shown]
[im 1/3]
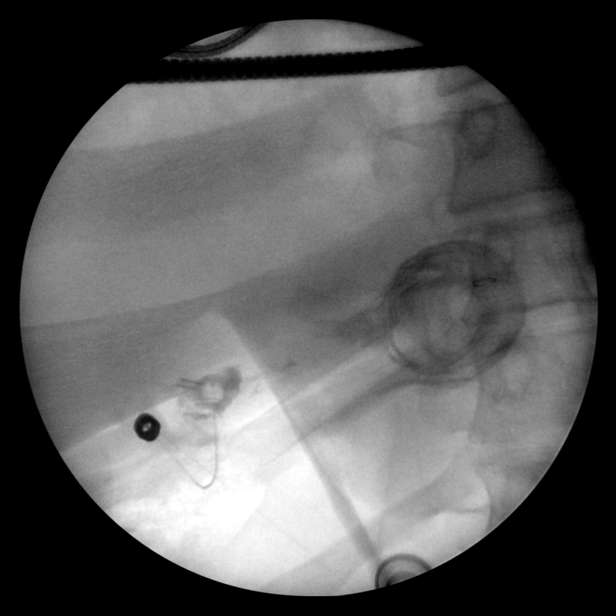
[im 1/3]
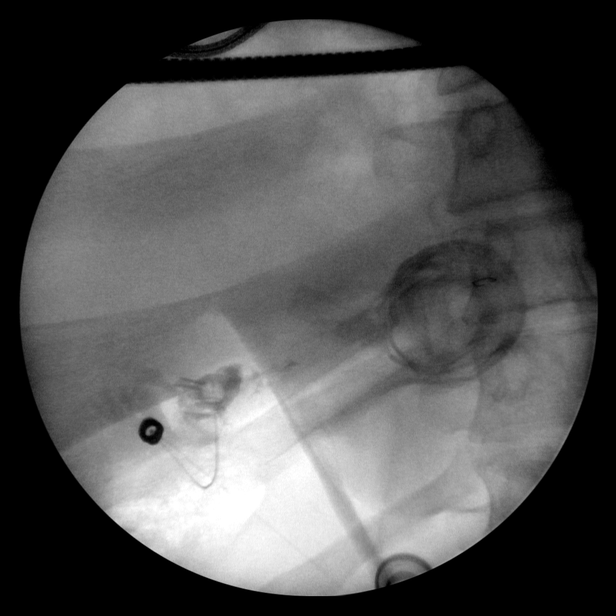
[im 1/3]
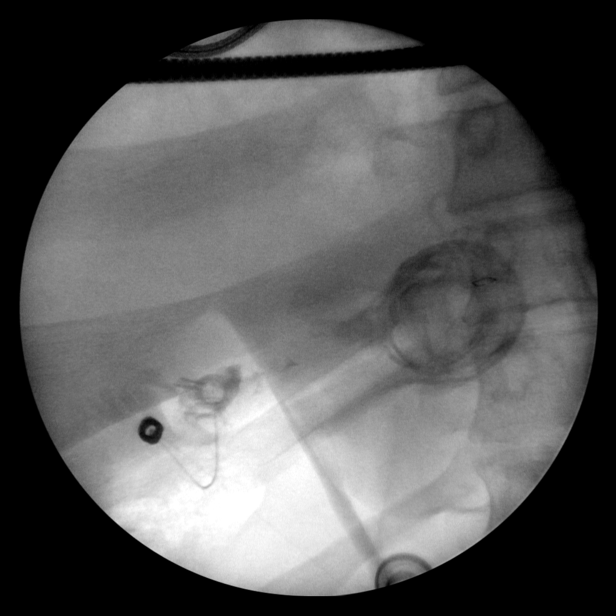
[im 1/3]
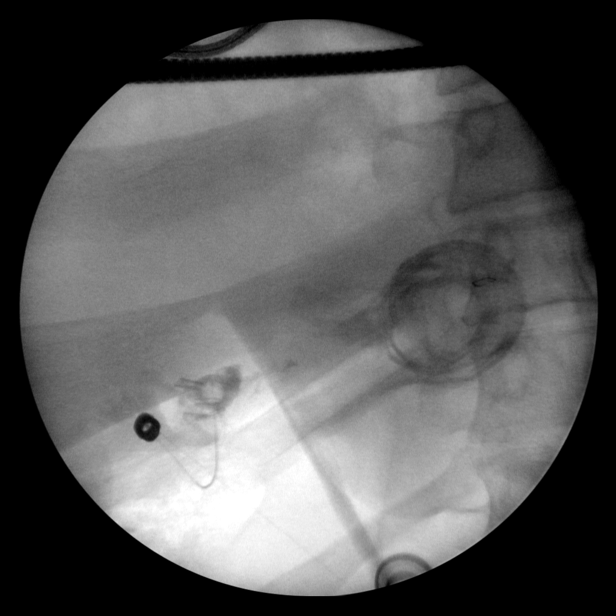
[im 2/3]
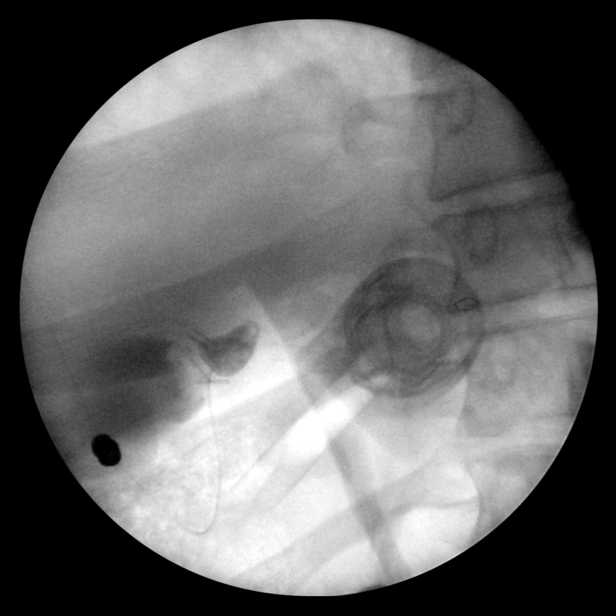
[im 2/3]
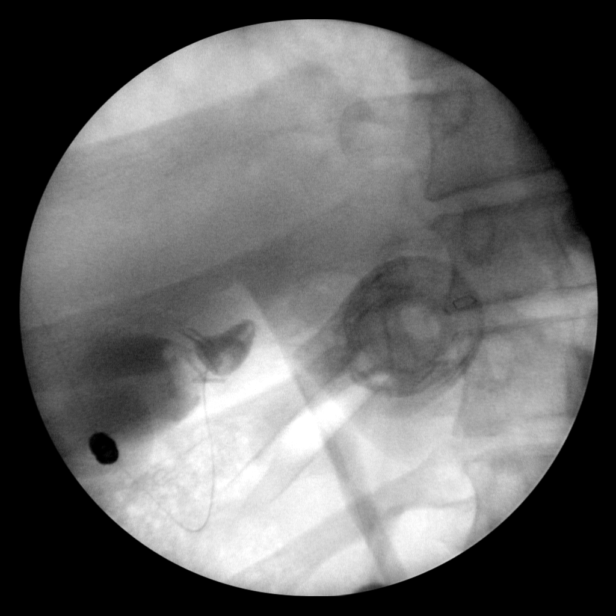
[im 2/3]
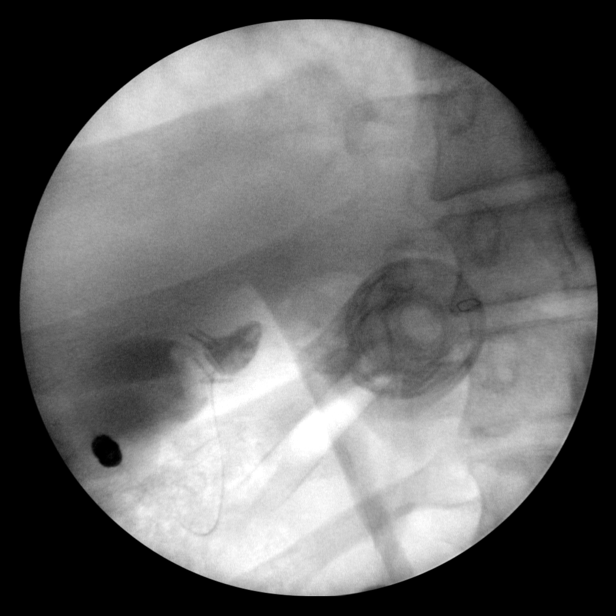
[im 2/3]
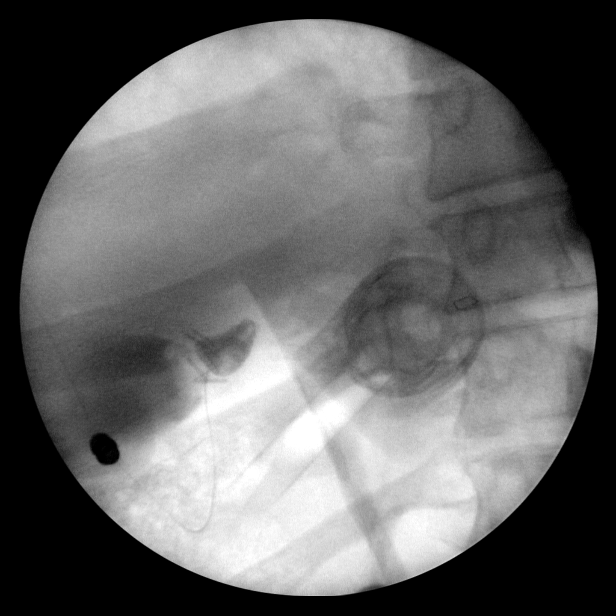
[im 3/3]
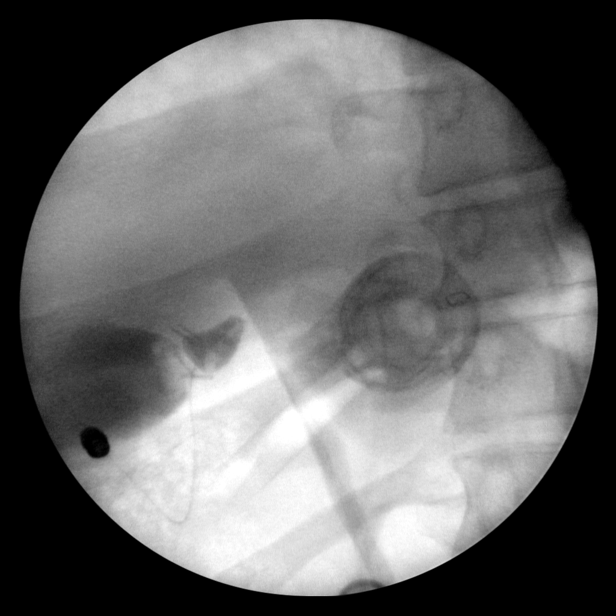
[im 3/3]
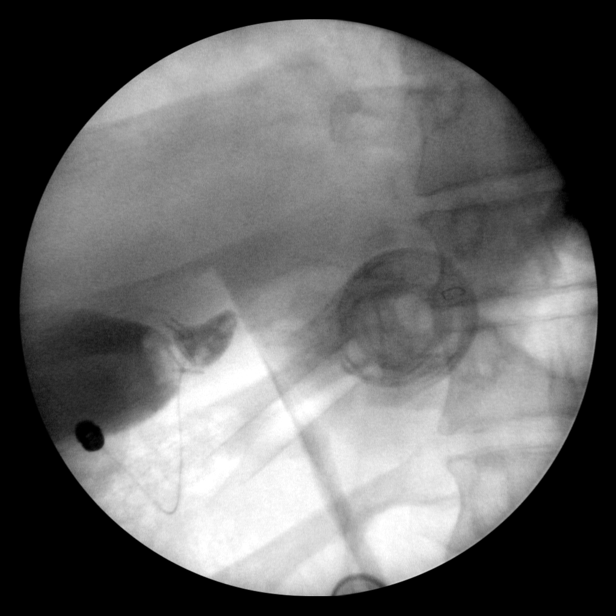
[im 3/3]
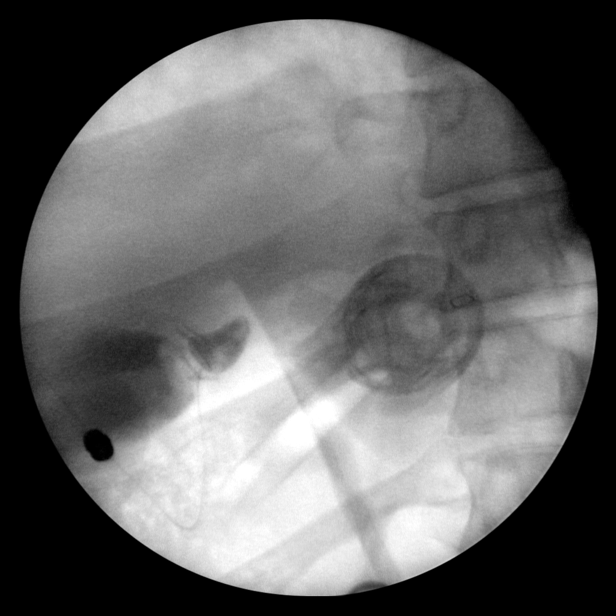
[im 3/3]
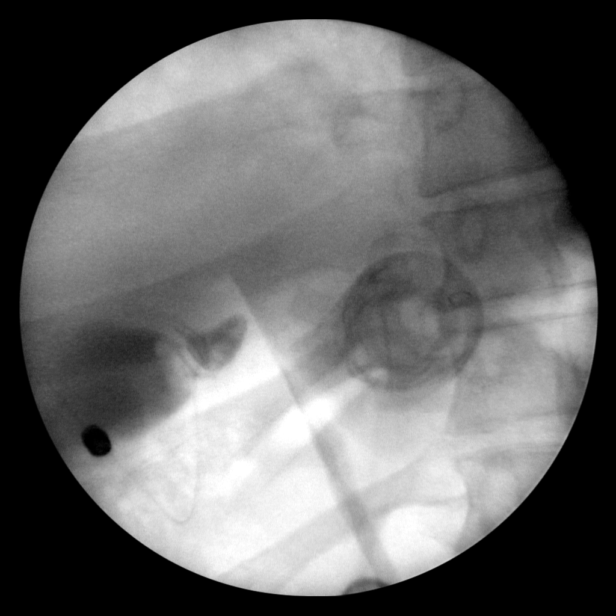

[12 of 12 positions shown; findings below may reference images not displayed]

FINDINGS: Three series of fluoroscopic spot views are provided.
It is unclear whether the cystic duct is adequately cannulated as
the vast majority of contrast material extravasates into the right
upper quadrant.  It is possible the patient has an obstructing
stone in the proximal with cystic duct but the provided images are
inadequate to establish a definitive diagnosis.
IMPRESSION: Stone at the proximal cystic duct versus incomplete
cannulation of the duct.

## 2013-12-12 ENCOUNTER — Encounter (INDEPENDENT_AMBULATORY_CARE_PROVIDER_SITE_OTHER): Payer: Self-pay

## 2013-12-12 ENCOUNTER — Ambulatory Visit (INDEPENDENT_AMBULATORY_CARE_PROVIDER_SITE_OTHER): Payer: BC Managed Care – PPO | Admitting: Physician Assistant

## 2013-12-12 ENCOUNTER — Telehealth (INDEPENDENT_AMBULATORY_CARE_PROVIDER_SITE_OTHER): Payer: Self-pay | Admitting: *Deleted

## 2013-12-12 VITALS — BP 132/84 | HR 92 | Temp 97.9°F | Resp 15 | Ht 72.0 in | Wt 350.4 lb

## 2013-12-12 DIAGNOSIS — Z4651 Encounter for fitting and adjustment of gastric lap band: Secondary | ICD-10-CM

## 2013-12-12 NOTE — Progress Notes (Signed)
  HISTORY: Manuel Wood is a 28 y.o.male who received an AP-Large lap-band in May 2011 by Dr. Daphine DeutscherMartin. He comes in having last been seen in June 2013 and has gained 30 lbs. He describes increased portions and hunger since his last visit when he received a 1 mL fill. He denies regurgitation or reflux symptoms. He would like a fill today to get back on track.  VITAL SIGNS: Filed Vitals:   12/12/13 1603  BP: 132/84  Pulse: 92  Temp: 97.9 F (36.6 C)  Resp: 15    PHYSICAL EXAM: Physical exam reveals a very well-appearing 27 y.o.male in no apparent distress Neurologic: Awake, alert, oriented Psych: Bright affect, conversant Respiratory: Breathing even and unlabored. No stridor or wheezing Abdomen: Soft, nontender, nondistended to palpation. Incisions well-healed. No incisional hernias. Port easily palpated. Extremities: Atraumatic, good range of motion.  ASSESMENT: 28 y.o.  male  s/p AP-Large lap-band.   PLAN: The patient's port was accessed with a 20G Huber needle without difficulty. Clear fluid was aspirated and 1 mL saline was added to the port. The patient was able to swallow water without difficulty following the procedure and was instructed to take clear liquids for the next 24-48 hours and advance slowly as tolerated.

## 2013-12-12 NOTE — Patient Instructions (Signed)

## 2013-12-12 NOTE — Telephone Encounter (Signed)
Called to see if pt could come in earlier than 4:15 this afternoon.  Anytime 1:30 or after is fine.  Left a message for him to return my call.Manuel [email or web address].jkw

## 2014-01-16 ENCOUNTER — Encounter (INDEPENDENT_AMBULATORY_CARE_PROVIDER_SITE_OTHER): Payer: BC Managed Care – PPO

## 2018-10-01 ENCOUNTER — Encounter (HOSPITAL_COMMUNITY): Payer: Self-pay

## 2019-10-04 ENCOUNTER — Other Ambulatory Visit: Payer: Self-pay

## 2019-10-04 ENCOUNTER — Emergency Department (HOSPITAL_COMMUNITY): Payer: BC Managed Care – PPO

## 2019-10-04 ENCOUNTER — Encounter (HOSPITAL_COMMUNITY): Payer: Self-pay | Admitting: Emergency Medicine

## 2019-10-04 ENCOUNTER — Emergency Department (HOSPITAL_COMMUNITY)
Admission: EM | Admit: 2019-10-04 | Discharge: 2019-10-04 | Disposition: A | Discharge status: Home / Self Care | Payer: BC Managed Care – PPO | Attending: Emergency Medicine | Admitting: Emergency Medicine

## 2019-10-04 DIAGNOSIS — U071 COVID-19: Secondary | ICD-10-CM | POA: Insufficient documentation

## 2019-10-04 LAB — CBC WITH DIFFERENTIAL/PLATELET
Abs Immature Granulocytes: 0.05 10*3/uL (ref 0.00–0.07)
Basophils Absolute: 0 10*3/uL (ref 0.0–0.1)
Basophils Relative: 0 %
Eosinophils Absolute: 0.1 10*3/uL (ref 0.0–0.5)
Eosinophils Relative: 1 %
HCT: 46.1 % (ref 39.0–52.0)
Hemoglobin: 15.7 g/dL (ref 13.0–17.0)
Immature Granulocytes: 1 %
Lymphocytes Relative: 30 %
Lymphs Abs: 1.9 10*3/uL (ref 0.7–4.0)
MCH: 28.8 pg (ref 26.0–34.0)
MCHC: 34.1 g/dL (ref 30.0–36.0)
MCV: 84.6 fL (ref 80.0–100.0)
Monocytes Absolute: 0.4 10*3/uL (ref 0.1–1.0)
Monocytes Relative: 7 %
Neutro Abs: 3.8 10*3/uL (ref 1.7–7.7)
Neutrophils Relative %: 61 %
Platelets: 230 10*3/uL (ref 150–400)
RBC: 5.45 MIL/uL (ref 4.22–5.81)
RDW: 13.4 % (ref 11.5–15.5)
WBC: 6.2 10*3/uL (ref 4.0–10.5)
nRBC: 0 % (ref 0.0–0.2)

## 2019-10-04 LAB — COMPREHENSIVE METABOLIC PANEL
ALT: 57 U/L — ABNORMAL HIGH (ref 0–44)
AST: 33 U/L (ref 15–41)
Albumin: 3.9 g/dL (ref 3.5–5.0)
Alkaline Phosphatase: 58 U/L (ref 38–126)
Anion gap: 8 (ref 5–15)
BUN: 8 mg/dL (ref 6–20)
CO2: 27 mmol/L (ref 22–32)
Calcium: 8.8 mg/dL — ABNORMAL LOW (ref 8.9–10.3)
Chloride: 104 mmol/L (ref 98–111)
Creatinine, Ser: 1.01 mg/dL (ref 0.61–1.24)
GFR calc Af Amer: >60 mL/min (ref >60)
GFR calc non Af Amer: >60 mL/min (ref >60)
Glucose, Bld: 106 mg/dL — ABNORMAL HIGH (ref 70–99)
Potassium: 4 mmol/L (ref 3.5–5.1)
Sodium: 139 mmol/L (ref 135–145)
Total Bilirubin: 1.1 mg/dL (ref 0.3–1.2)
Total Protein: 7.1 g/dL (ref 6.5–8.1)

## 2019-10-04 MED ORDER — SODIUM CHLORIDE 0.9 % IV BOLUS
1000.0000 mL | Freq: Once | INTRAVENOUS | Status: AC
  Administered 2019-10-04: 13:00:00 1000 mL via INTRAVENOUS

## 2019-10-04 MED ORDER — DEXAMETHASONE SODIUM PHOSPHATE 10 MG/ML IJ SOLN
10.0000 mg | Freq: Once | INTRAMUSCULAR | Status: AC
  Administered 2019-10-04: 10 mg via INTRAVENOUS
  Filled 2019-10-04: qty 1

## 2019-10-04 MED ORDER — ALBUTEROL SULFATE HFA 108 (90 BASE) MCG/ACT IN AERS
1.0000 | INHALATION_SPRAY | Freq: Once | RESPIRATORY_TRACT | Status: AC
  Administered 2019-10-04: 2 via RESPIRATORY_TRACT
  Filled 2019-10-04: qty 6.7

## 2019-10-04 NOTE — Discharge Instructions (Signed)
Thank you for allowing [name] to care for you today.   Please return to the emergency department if you have any new or worsening symptoms.  Your blood work and chest xray today were all normal.  Medications- You can take medications to help treat your symptoms: -Tylenol for fever and body aches. Please take as prescribed on the bottle. -Over the coutner cough medicine such as mucinex, robitussin, or other brands. -Clartin or zyrtec for allergies. Take as directed on the bottle  Treatment- This is a virus and unfortunately there are no antibitotics approved to treat this virus at this time. It is important to monitor your symptoms closely: -You should have a theremometer at home to check your temperature when feeling feverish. -Use a pulse ox meter to measure your oxygen when feeling short of breath.  -If your fever is over 100.4 despite taking tylenol or if your oxygen level drops below 94% these are reasons to rturn to the emergency department for further evaluation. Please call the emergency department before you come to make [name] aware.    We recommend you self-isolate for 10 days and to inform your work/family/friends that you has the virus.  They will need to self-quarantine for 14 days to monitor for symptoms.    Again: symptoms of shortnessf breath, chest pain, difficulty breathing, new onset of confuison, any symptoms that are concerning. And you or the person should come to emergency department for evaluation.   I hope you feel better soon

## 2019-10-04 NOTE — ED Provider Notes (Signed)
[facility] EMERGENCY DEPARTMENT Provider Note   CSN: [id number] Arrival date & time: 10/04/19  1127     History   Chief Complaint Chief Complaint  Patient presents with  . covid +    HPI Manuel Wood is a 33 y.o. male past medical history significant for gastric bypass surgery presents to emergency department today with chief complaint of shortness of breath x1 week.  Patient states he tested positive for Covid on 09/30/2019.  Since then he has had dizziness, weakness, generalized fatigue.  He states when he lays down to go to bed he feels dizzy as if the room is spinning.  He has tried using his nebulizer at home with minimal symptom relief.  He also states he has had pneumonia every year and is worried about developing pneumonia now so he started taking amoxicillin from an old prescription.  He states the last time he had fever was x3 days ago.  States he had a cough at symptom onset but that has since improved.  He also had one episode of nonbloody diarrhea yesterday. He denies sore throat, nasal congestion, chest pain, abdominal pain, nausea, vomiting, rash, urinary symptoms.   History reviewed. No pertinent past medical history.  There are no active problems to display for this patient.   Past Surgical History:  Procedure Laterality Date  . CHOLECYSTECTOMY    . LAPAROSCOPIC GASTRIC BANDING  2011        Home Medications    Prior to Admission medications   Not on File    Family History Family History  Problem Relation Age of Onset  . Cancer Father        prostate    Social History Social History   Tobacco Use  . Smoking status: Never Smoker  . Smokeless tobacco: Never Used  Substance Use Topics  . Alcohol use: No  . Drug use: No     Allergies   Patient has no known allergies.   Review of Systems Review of Systems All other systems are reviewed and are negative for acute change except as noted in the HPI.   Physical Exam  Updated Vital Signs BP (!) 149/86   Pulse 86   Temp 98.5 F (36.9 C) (Oral)   Resp 19   Ht 6' (1.829 m)   Wt (!) 165.6 kg   SpO2 98%   BMI 49.50 kg/m   Physical Exam Vitals signs and nursing note reviewed.  Constitutional:      General: He is not in acute distress.    Appearance: He is not ill-appearing.  HENT:     Head: Normocephalic and atraumatic.     Comments: No sinus or temporal tenderness.    Right Ear: Tympanic membrane and external ear normal.     Left Ear: Tympanic membrane and external ear normal.     Nose: Nose normal.     Mouth/Throat:     Mouth: Mucous membranes are dry.     Pharynx: Oropharynx is clear.     Comments: No erythema to oropharynx, no edema, no exudate, no tonsillar swelling, voice normal, neck supple without lymphadenopathy  Eyes:     General: No scleral icterus.       Right eye: No discharge.        Left eye: No discharge.     Extraocular Movements: Extraocular movements intact.     Conjunctiva/sclera: Conjunctivae normal.     Pupils: Pupils are equal, round, and reactive to light.  Neck:  Musculoskeletal: Normal range of motion.     Vascular: No JVD.  Cardiovascular:     Rate and Rhythm: Normal rate and regular rhythm.     Pulses: Normal pulses.          Radial pulses are 2+ on the right side and 2+ on the left side.     Heart sounds: Normal heart sounds.  Pulmonary:     Comments: Lungs clear to auscultation in all fields. Symmetric chest rise. No wheezing, rales, or rhonchi. Abdominal:     Comments: Abdomen is soft, non-distended, and non-tender in all quadrants. No rigidity, no guarding. No peritoneal signs.  Musculoskeletal: Normal range of motion.  Skin:    General: Skin is warm and dry.     Capillary Refill: Capillary refill takes less than 2 seconds.  Neurological:     Mental Status: He is oriented to person, place, and time.     GCS: GCS eye subscore is 4. GCS verbal subscore is 5. GCS motor subscore is 6.     Comments:  Speech is clear and goal oriented, follows commands CN III-XII intact, no facial droop Normal strength in upper and lower extremities bilaterally including dorsiflexion and plantar flexion, strong and equal grip strength Sensation normal to light and sharp touch Moves extremities without ataxia, coordination intact Normal finger to nose and rapid alternating movements Normal gait and balance  Psychiatric:        Behavior: Behavior normal.      ED Treatments / Results  Labs (all labs ordered are listed, but only abnormal results are displayed) Labs Reviewed  COMPREHENSIVE METABOLIC PANEL - Abnormal; Notable for the following components:      Result Value   Glucose, Bld 106 (*)    Calcium 8.8 (*)    ALT 57 (*)    All other components within normal limits  CBC WITH DIFFERENTIAL/PLATELET    EKG None  Radiology Dg Chest Portable 1 View  Result Date: 10/04/2019 CLINICAL DATA:  COVID-19 positive, shortness of breath, weakness EXAM: PORTABLE CHEST 1 VIEW COMPARISON:  12/14/2009 FINDINGS: The heart size and mediastinal contours are within normal limits. No focal airspace consolidation, pleural effusion, or pneumothorax. The visualized skeletal structures are unremarkable. IMPRESSION: No acute cardiopulmonary findings. Electronically Signed   By: Duanne GuessNicholas  Plundo M.D.   On: 10/04/2019 12:42    Procedures Procedures (including critical care time)  Medications Ordered in ED Medications  sodium chloride 0.9 % bolus 1,000 mL (1,000 mLs Intravenous New Bag/Given 10/04/19 1233)  dexamethasone (DECADRON) injection 10 mg (10 mg Intravenous Given 10/04/19 1300)  albuterol (VENTOLIN HFA) 108 (90 Base) MCG/ACT inhaler 1-2 puff (2 puffs Inhalation Given 10/04/19 1300)     Initial Impression / Assessment and Plan / ED Course  I have reviewed the triage vital signs and the nursing notes.  Pertinent labs & imaging results that were available during my care of the patient were reviewed by me  and considered in my medical decision making (see chart for details).  Patient seen and examined.  He reports positive Covid diagnosis x4 days ago at urgent care.  Patient nontoxic appearing, in no apparent distress.  Signs are stable, no fever, no hypoxia, no tachycardia.  On exam patient has dry mucous membranes otherwise appears well.  Lungs are clear to auscultation all fields.  Normal work of breathing, symmetric chest rise.  No signs of respiratory distress.  Abdomen is nontender, no peritoneal signs.  Normal neuro exam.  Labs today with no leukocytosis,  no anemia, no severe electrolyte abnormalities, no renal sufficiency. I viewed pt's chest xray and it does not suggest acute infectious processes. Pt given IVF, decadron, albuterol. I ambulated patient around the room and did so without respiratory distress or difficulty.  SpO2 stayed above 97% on room air. On reassessment he reports feeling better after IVF and medications. Had lengthy discussion on symptomatic care and patient feels comfortable managing symptoms at home. The patient appears reasonably screened and/or stabilized for discharge and I doubt any other medical condition or other [facility] requiring further screening, evaluation, or treatment in the ED at this time prior to discharge. The patient is safe for discharge with strict return precautions discussed. Recommend pcp follow up if symptoms persist.   Undray Allman was evaluated in Emergency Department on 10/04/2019 for the symptoms described in the history of present illness. He was evaluated in the context of the global COVID-19 pandemic, which necessitated consideration that the patient might be at risk for infection with the SARS-CoV-2 virus that causes COVID-19. Institutional protocols and algorithms that pertain to the evaluation of patients at risk for COVID-19 are in a state of rapid change based on information released by regulatory bodies including the CDC and federal and state  organizations. These policies and algorithms were followed during the patient's care in the ED.  Portions of this note were generated with [employer]. Dictation errors may occur despite best attempts at proofreading.    Final Clinical Impressions(s) / ED Diagnoses   Final diagnoses:  COVID-19 virus infection    ED Discharge Orders    None       Cherre Robins, PA-C 10/04/19 1414    Lacretia Leigh, MD 10/05/19 1449

## 2019-10-04 NOTE — ED Triage Notes (Signed)
Pt tested covid positive Monday. Reports taking 2 nebulizer treatments and albuterol at home with no significant change in sob or wheezing. Pt endorses dizziness, weakness, and sob today. Has been having trouble sleeping due to feeling "drunk" when he lies down. Says he is prone to pneumonia so started taking amoxicllin from previously filled prescription. Last had fever Tuesday.

## 2019-10-07 ENCOUNTER — Emergency Department (HOSPITAL_COMMUNITY)
Admission: EM | Admit: 2019-10-07 | Discharge: 2019-10-08 | Disposition: A | Discharge status: Home / Self Care | Payer: BC Managed Care – PPO | Attending: Emergency Medicine | Admitting: Emergency Medicine

## 2019-10-07 ENCOUNTER — Encounter (HOSPITAL_COMMUNITY): Payer: Self-pay | Admitting: Emergency Medicine

## 2019-10-07 ENCOUNTER — Other Ambulatory Visit: Payer: Self-pay

## 2019-10-07 ENCOUNTER — Emergency Department (HOSPITAL_COMMUNITY): Payer: BC Managed Care – PPO

## 2019-10-07 DIAGNOSIS — U071 COVID-19: Secondary | ICD-10-CM | POA: Diagnosis not present

## 2019-10-07 DIAGNOSIS — R0789 Other chest pain: Secondary | ICD-10-CM | POA: Diagnosis present

## 2019-10-07 LAB — BASIC METABOLIC PANEL
Anion gap: 10 (ref 5–15)
BUN: 8 mg/dL (ref 6–20)
CO2: 24 mmol/L (ref 22–32)
Calcium: 8.9 mg/dL (ref 8.9–10.3)
Chloride: 104 mmol/L (ref 98–111)
Creatinine, Ser: 0.93 mg/dL (ref 0.61–1.24)
GFR calc Af Amer: >60 mL/min (ref >60)
GFR calc non Af Amer: >60 mL/min (ref >60)
Glucose, Bld: 110 mg/dL — ABNORMAL HIGH (ref 70–99)
Potassium: 3.5 mmol/L (ref 3.5–5.1)
Sodium: 138 mmol/L (ref 135–145)

## 2019-10-07 LAB — CBC
HCT: 44.2 % (ref 39.0–52.0)
Hemoglobin: 15 g/dL (ref 13.0–17.0)
MCH: 28.5 pg (ref 26.0–34.0)
MCHC: 33.9 g/dL (ref 30.0–36.0)
MCV: 84 fL (ref 80.0–100.0)
Platelets: 277 10*3/uL (ref 150–400)
RBC: 5.26 MIL/uL (ref 4.22–5.81)
RDW: 13.2 % (ref 11.5–15.5)
WBC: 9.3 10*3/uL (ref 4.0–10.5)
nRBC: 0 % (ref 0.0–0.2)

## 2019-10-07 LAB — TROPONIN I (HIGH SENSITIVITY): Troponin I (High Sensitivity): 4 ng/L (ref <18)

## 2019-10-07 MED ORDER — SODIUM CHLORIDE 0.9% FLUSH
3.0000 mL | Freq: Once | INTRAVENOUS | Status: AC
  Administered 2019-10-08: 3 mL via INTRAVENOUS

## 2019-10-07 NOTE — ED Triage Notes (Signed)
Pt states he has been taking his antibiotics for the "COVID"  Today around 4pm he experienced chest  Pain "my heart was going to come out of my chest."  Pain is now a 4.

## 2019-10-08 LAB — TROPONIN I (HIGH SENSITIVITY): Troponin I (High Sensitivity): 5 ng/L (ref <18)

## 2019-10-08 MED ORDER — SODIUM CHLORIDE 0.9 % IV BOLUS
1000.0000 mL | Freq: Once | INTRAVENOUS | Status: AC
  Administered 2019-10-08: 1000 mL via INTRAVENOUS

## 2019-10-08 NOTE — Discharge Instructions (Signed)
Return for new or worsening symptoms.  Otherwise, continue to get plenty of rest and drink plenty of fluid.  You should quarantine until 14 days after your diagnosis.

## 2019-10-08 NOTE — ED Provider Notes (Signed)
[facility] EMERGENCY DEPARTMENT Provider Note   CSN: [id number] Arrival date & time: 10/07/19  2054     History   Chief Complaint Chief Complaint  Patient presents with  . Chest Pain  . COVID +    HPI Manuel Wood is a 33 y.o. male.     Patient presents to the emergency department with a chief complaint of chest pain.  He reports that he was diagnosed with Covid-19 on 11/23.  He reports having some associated burning sensation in his chest.  Reports having a rapid heart rate this evening, which he measured at 120 bpm.  This has since resolved.  He denies any cough.  He states that he sometimes feels swimmy headed.  He has been trying to stay hydrated.  Reports that he has had some diarrhea.  The history is provided by the patient. No language interpreter was used.    History reviewed. No pertinent past medical history.  There are no active problems to display for this patient.   Past Surgical History:  Procedure Laterality Date  . CHOLECYSTECTOMY    . LAPAROSCOPIC GASTRIC BANDING  2011        Home Medications    Prior to Admission medications   Not on File    Family History Family History  Problem Relation Age of Onset  . Cancer Father        prostate    Social History Social History   Tobacco Use  . Smoking status: Never Smoker  . Smokeless tobacco: Never Used  Substance Use Topics  . Alcohol use: No  . Drug use: No     Allergies   Patient has no known allergies.   Review of Systems Review of Systems  All other systems reviewed and are negative.    Physical Exam Updated Vital Signs BP (!) 174/88 (BP Location: Right Arm)   Pulse 83   Temp 98 F (36.7 C) (Oral)   Resp 16   Ht 6' (1.829 m)   Wt (!) 165.6 kg   SpO2 96%   BMI 49.50 kg/m   Physical Exam Vitals signs and nursing note reviewed.  Constitutional:      Appearance: He is well-developed.  HENT:     Head: Normocephalic and atraumatic.  Eyes:   Conjunctiva/sclera: Conjunctivae normal.  Neck:     Musculoskeletal: Neck supple.  Cardiovascular:     Rate and Rhythm: Normal rate and regular rhythm.     Heart sounds: No murmur.  Pulmonary:     Effort: Pulmonary effort is normal. No respiratory distress.     Breath sounds: Normal breath sounds.  Abdominal:     Palpations: Abdomen is soft.     Tenderness: There is no abdominal tenderness.  Skin:    General: Skin is warm and dry.  Neurological:     Mental Status: He is alert.  Psychiatric:        Mood and Affect: Mood normal.        Behavior: Behavior normal.      ED Treatments / Results  Labs (all labs ordered are listed, but only abnormal results are displayed) Labs Reviewed  BASIC METABOLIC PANEL - Abnormal; Notable for the following components:      Result Value   Glucose, Bld 110 (*)    All other components within normal limits  CBC  TROPONIN I (HIGH SENSITIVITY)  TROPONIN I (HIGH SENSITIVITY)    EKG EKG Interpretation  Date/Time:  Monday October 07 2019  21:07:28 EST Ventricular Rate:  82 PR Interval:  146 QRS Duration: 118 QT Interval:  370 QTC Calculation: 432 R Axis:   41 Text Interpretation: Normal sinus rhythm Non-specific intra-ventricular conduction delay Borderline ECG Technically poor tracing no charge, needs repeat Confirmed by Merrily Pew [phone]) on 10/07/2019 11:06:06 PM   Radiology Dg Chest Portable 1 View  Result Date: 10/07/2019 CLINICAL DATA:  Chest pain. EXAM: PORTABLE CHEST 1 VIEW COMPARISON:  October 04, 2019 FINDINGS: The heart size is significantly enlarged. There is no pneumothorax. No large pleural effusion. No focal infiltrate. No acute osseous abnormality. IMPRESSION: No acute cardiopulmonary process. Electronically Signed   By: Constance Holster M.D.   On: 10/07/2019 21:55    Procedures Procedures (including critical care time)  Medications Ordered in ED Medications  sodium chloride flush (NS) 0.9 % injection 3 mL (3  mLs Intravenous Given 10/08/19 0512)  sodium chloride 0.9 % bolus 1,000 mL (1,000 mLs Intravenous New Bag/Given 10/08/19 0513)     Initial Impression / Assessment and Plan / ED Course  I have reviewed the triage vital signs and the nursing notes.  Pertinent labs & imaging results that were available during my care of the patient were reviewed by me and considered in my medical decision making (see chart for details).        Patient with known Covid 41.  Vital signs are stable.  He is not hypoxic.  He is not tachycardic.  He has no leg swelling.  He has good pulses distally.  He does report having some chest pain, but has 2 - troponins.  EKG shows no ischemic changes.  As he is not hypoxic and not tachycardic, I doubt PE.  We will give a liter of fluid and reassess.  Anticipate discharged home.  Final Clinical Impressions(s) / ED Diagnoses   Final diagnoses:  [id number]    ED Discharge Orders    None       Montine Circle, PA-C 10/08/19 5809    Mesner, Corene Cornea, MD 10/09/19 [phone]

## 2019-12-06 DIAGNOSIS — I160 Hypertensive urgency: Secondary | ICD-10-CM

## 2019-12-06 DIAGNOSIS — R0789 Other chest pain: Secondary | ICD-10-CM | POA: Diagnosis not present

## 2019-12-06 DIAGNOSIS — D72829 Elevated white blood cell count, unspecified: Secondary | ICD-10-CM

## 2019-12-06 DIAGNOSIS — R079 Chest pain, unspecified: Secondary | ICD-10-CM

## 2019-12-07 DIAGNOSIS — R0789 Other chest pain: Secondary | ICD-10-CM | POA: Diagnosis not present

## 2019-12-07 DIAGNOSIS — D72829 Elevated white blood cell count, unspecified: Secondary | ICD-10-CM | POA: Diagnosis not present

## 2019-12-07 DIAGNOSIS — R079 Chest pain, unspecified: Secondary | ICD-10-CM | POA: Diagnosis not present

## 2019-12-07 DIAGNOSIS — I160 Hypertensive urgency: Secondary | ICD-10-CM | POA: Diagnosis not present

## 2019-12-08 DIAGNOSIS — E785 Hyperlipidemia, unspecified: Secondary | ICD-10-CM

## 2019-12-08 DIAGNOSIS — R0789 Other chest pain: Secondary | ICD-10-CM

## 2019-12-08 DIAGNOSIS — F419 Anxiety disorder, unspecified: Secondary | ICD-10-CM

## 2019-12-08 DIAGNOSIS — I10 Essential (primary) hypertension: Secondary | ICD-10-CM

## 2019-12-10 ENCOUNTER — Telehealth: Payer: Self-pay | Admitting: Cardiology

## 2019-12-10 DIAGNOSIS — R079 Chest pain, unspecified: Secondary | ICD-10-CM

## 2019-12-10 NOTE — Telephone Encounter (Signed)
He has we are making arrangements for this

## 2019-12-10 NOTE — Telephone Encounter (Signed)
New Message    Pt is calling and states he saw Dr Bing Matter in [facility] and that he was suppose to have a request for CT. He says he has heard nothing to schedule it     Please advise

## 2019-12-11 NOTE — Telephone Encounter (Signed)
Your cardiac CT will be scheduled at one of the below locations:   [facility] [address] [place], [place] [address] [phone]  [place] [address] [place], [place] [address] [phone]  If scheduled at [facility], please arrive at the [facility] main entrance of [facility] 30 minutes prior to test start time. Proceed to the [facility] Radiology Department (first floor) to check-in and test prep.  If scheduled at [facility], please arrive 15 mins early for check-in and test prep.  Please follow these instructions carefully (unless otherwise directed):  Hold all erectile dysfunction medications at least 3 days (72 hrs) prior to test.  On the Night Before the Test: . Be sure to Drink plenty of water. . Do not consume any caffeinated/decaffeinated beverages or chocolate 12 hours prior to your test. . Do not take any antihistamines 12 hours prior to your test.   On the Day of the Test: . Drink plenty of water. Do not drink any water within one hour of the test. . Do not eat any food 4 hours prior to the test. . You may take your regular medications prior to the test.  . Take metoprolol (Lopressor) two hours prior to test. . HOLD Furosemide/Hydrochlorothiazide morning of the test. .    *For Clinical Staff only. Please instruct patient the following:*        -Drink plenty of water       -Hold Furosemide/hydrochlorothiazide morning of the test       -Take metoprolol (Lopressor) 2 hours prior to test (if applicable).                  -If HR is less than 55 BPM- No Beta Blocker                -IF HR is greater than 55 BPM and patient is less than or equal to 19 yrs old Lopressor 100mg  x1.                -If HR is greater than 55 BPM and patient is greater than 32 yrs old Lopressor 50 mg x1.     Do not give Lopressor to patients with an allergy to  lopressor or anyone with asthma or active COPD symptoms (currently taking steroids).       After the Test: . Drink plenty of water. . After receiving IV contrast, you may experience a mild flushed feeling. This is normal. . On occasion, you may experience a mild rash up to 24 hours after the test. This is not dangerous. If this occurs, you can take Benadryl 25 mg and increase your fluid intake. . If you experience trouble breathing, this can be serious. If it is severe call 911 IMMEDIATELY. If it is mild, please call our office. . If you take any of these medications: Glipizide/Metformin, Avandament, Glucavance, please do not take 48 hours after completing test unless otherwise instructed.   Once we have confirmed authorization from your insurance company, we will call you to set up a date and time for your test.   For non-scheduling related questions, please contact the cardiac imaging nurse navigator should you have any questions/concerns: Marchia Bond, RN Navigator Cardiac Imaging [facility] [phone] mobile    Called patient per [facility] and told him of Dr. Wendy Poet recommendation for him to have a cardiac ct. The patient was  informed of all information above. He will take his metoprolol tartrate 25 mg before as he already takes that. He denies any allergies. Patient aware once he gets scheduled to come have labs 3-7 days before that. He was also scheduled for follow up appointment. No further questions.

## 2019-12-27 ENCOUNTER — Encounter: Payer: Self-pay | Admitting: Cardiology

## 2019-12-27 ENCOUNTER — Other Ambulatory Visit: Payer: Self-pay

## 2019-12-27 ENCOUNTER — Ambulatory Visit: Payer: BC Managed Care – PPO | Admitting: Cardiology

## 2019-12-27 ENCOUNTER — Ambulatory Visit (INDEPENDENT_AMBULATORY_CARE_PROVIDER_SITE_OTHER): Payer: BC Managed Care – PPO | Admitting: Cardiology

## 2019-12-27 DIAGNOSIS — I10 Essential (primary) hypertension: Secondary | ICD-10-CM | POA: Diagnosis not present

## 2019-12-27 DIAGNOSIS — R0789 Other chest pain: Secondary | ICD-10-CM

## 2019-12-27 NOTE — Patient Instructions (Signed)
Medication Instructions:  Your physician recommends that you continue on your current medications as directed. Please refer to the Current Medication list given to you today.  *If you need a refill on your cardiac medications before your next appointment, please call your pharmacy*  Lab Work: None.  If you have labs (blood work) drawn today and your tests are completely normal, you will receive your results only by: . MyChart Message (if you have MyChart) OR . A paper copy in the mail If you have any lab test that is abnormal or we need to change your treatment, we will call you to review the results.  Testing/Procedures: None.   Follow-Up: At [facility], you and your health needs are our priority.  As part of our continuing mission to provide you with exceptional heart care, we have created designated Provider Care Teams.  These Care Teams include your primary Cardiologist (physician) and Advanced Practice Providers (APPs -  Physician Assistants and Nurse Practitioners) who all work together to provide you with the care you need, when you need it.  Your next appointment:   2 month(s)  The format for your next appointment:   In Person  Provider:   Robert Krasowski, MD  Other Instructions  

## 2019-12-27 NOTE — Progress Notes (Signed)
Cardiology Office Note:    Date:  12/27/2019   ID:  Manuel Wood, DOB 02/17/1986, MRN [id number]  PCP:  Patient, No Pcp Per  Cardiologist:  Gypsy Balsam, MD    Referring MD: No ref. provider found   No chief complaint on file. Doing well  History of Present Illness:    Manuel Wood is a 34 y.o. male I recommend a hospital where he came because of atypical chest pain.  Complex 1/2 months before he got coronavirus infection.  He presented to the hospital with atypical chest pain.  Stress test was done, however, report was very ambiguous.  Since that time he has been doing quite well.  He gradually try to exercise with no exertional chest pain.  He is scheduled to have a calcium scoring CT angio. Manuel Wood  Denies having any chest pain, tightness, pressure, burning in the chest.  No past medical history on file.  Past Surgical History:  Procedure Laterality Date  . CHOLECYSTECTOMY    . LAPAROSCOPIC GASTRIC BANDING  2011    Current Medications: Current Meds  Medication Sig  . ARIPiprazole (ABILIFY) 5 MG tablet Take 5 mg by mouth daily.  Manuel Wood escitalopram (LEXAPRO) 10 MG tablet Take 10 mg by mouth daily.  . hydrOXYzine (ATARAX/VISTARIL) 25 MG tablet Take 25 mg by mouth every 6 (six) hours as needed.  Manuel Wood lisinopril (ZESTRIL) 10 MG tablet Take 10 mg by mouth daily.  . metoprolol succinate (TOPROL-XL) 100 MG 24 hr tablet Take 100 mg by mouth daily.  . pantoprazole (PROTONIX) 40 MG tablet Take 40 mg by mouth daily.     Allergies:   Patient has no known allergies.   Social History   Socioeconomic History  . Marital status: Single    Spouse name: Not on file  . Number of children: Not on file  . Years of education: Not on file  . Highest education level: Not on file  Occupational History  . Not on file  Tobacco Use  . Smoking status: Never Smoker  . Smokeless tobacco: Never Used  Substance and Sexual Activity  . Alcohol use: No  . Drug use: No  . Sexual activity: Not on file    Other Topics Concern  . Not on file  Social History Narrative  . Not on file   Social Determinants of Health   Financial Resource Strain:   . Difficulty of Paying Living Expenses: Not on file  Food Insecurity:   . Worried About Programme researcher, broadcasting/film/video in the Last Year: Not on file  . Ran Out of Food in the Last Year: Not on file  Transportation Needs:   . Lack of Transportation (Medical): Not on file  . Lack of Transportation (Non-Medical): Not on file  Physical Activity:   . Days of Exercise per Week: Not on file  . Minutes of Exercise per Session: Not on file  Stress:   . Feeling of Stress : Not on file  Social Connections:   . Frequency of Communication with Friends and Family: Not on file  . Frequency of Social Gatherings with Friends and Family: Not on file  . Attends Religious Services: Not on file  . Active Member of Clubs or Organizations: Not on file  . Attends Banker Meetings: Not on file  . Marital Status: Not on file     Family History: The patient's family history includes Cancer in his father. ROS:   Please see the history of present illness.  All 14 point review of systems negative except as described per history of present illness  EKGs/Labs/Other Studies Reviewed:      Recent Labs: 10/04/2019: ALT 57 10/07/2019: BUN 8; Creatinine, Ser 0.93; Hemoglobin 15.0; Platelets 277; Potassium 3.5; Sodium 138  Recent Lipid Panel    Component Value Date/Time   CHOL 169 03/14/2011 2010   TRIG 128 03/14/2011 2010   HDL 32 (L) 03/14/2011 2010   CHOLHDL 5.3 03/14/2011 2010   VLDL 26 03/14/2011 2010   LDLCALC (H) 03/14/2011 2010    111        Total Cholesterol/HDL:CHD Risk Coronary Heart Disease Risk Table                     Men   Women  1/2 Average Risk   3.4   3.3  Average Risk       5.0   4.4  2 X Average Risk   9.6   7.1  3 X Average Risk  23.4   11.0        Use the calculated Patient Ratio above and the CHD Risk Table to determine the  patient's CHD Risk.        ATP III CLASSIFICATION (LDL):  <100     mg/dL   Optimal  100-129  mg/dL   Near or Above                    Optimal  130-159  mg/dL   Borderline  160-189  mg/dL   High  >190     mg/dL   Very High    Physical Exam:    VS:  BP (!) 142/106   Pulse 99   Temp (!) 96.8 F (36 C)   Ht 6' (1.829 m)   Wt (!) 383 lb 12.8 oz (174.1 kg)   SpO2 95%   BMI 52.05 kg/m     Wt Readings from Last 3 Encounters:  12/27/19 (!) 383 lb 12.8 oz (174.1 kg)  10/07/19 (!) 365 lb (165.6 kg)  10/04/19 (!) 365 lb (165.6 kg)     GEN:  Well nourished, well developed in no acute distress HEENT: Normal NECK: No JVD; No carotid bruits LYMPHATICS: No lymphadenopathy CARDIAC: RRR, no murmurs, no rubs, no gallops RESPIRATORY:  Clear to auscultation without rales, wheezing or rhonchi  ABDOMEN: Soft, non-tender, non-distended MUSCULOSKELETAL:  No edema; No deformity  SKIN: Warm and dry LOWER EXTREMITIES: no swelling NEUROLOGIC:  Alert and oriented x 3 PSYCHIATRIC:  Normal affect   ASSESSMENT:    1. Atypical chest pain   2. Essential hypertension   3. Morbid obesity ([place])    PLAN:    In order of problems listed above:  1. Atypical chest pain awaiting CT angiography to circumflex coronaries. 2. Essential hypertension he does have blood pressure monitor at home check it on a regular basis and is usually good.  Asking to continue checking his blood pressure.  In the meantime we will continue with lisinopril and metoprolol. 3. Obesity he noticed a problem continue exercises.   Medication Adjustments/Labs and Tests Ordered: Current medicines are reviewed at length with the patient today.  Concerns regarding medicines are outlined above.  No orders of the defined types were placed in this encounter.  Medication changes: No orders of the defined types were placed in this encounter.   Signed, Manuel Liter, MD, [facility] 12/27/2019 1:44 PM    Manuel Wood

## 2020-01-16 LAB — BASIC METABOLIC PANEL
BUN/Creatinine Ratio: 11 (ref 9–20)
BUN: 10 mg/dL (ref 6–20)
CO2: 22 mmol/L (ref 20–29)
Calcium: 9.2 mg/dL (ref 8.7–10.2)
Chloride: 102 mmol/L (ref 96–106)
Creatinine, Ser: 0.95 mg/dL (ref 0.76–1.27)
GFR calc Af Amer: 120 mL/min/{1.73_m2} (ref >59)
GFR calc non Af Amer: 104 mL/min/{1.73_m2} (ref >59)
Glucose: 103 mg/dL — ABNORMAL HIGH (ref 65–99)
Potassium: 4 mmol/L (ref 3.5–5.2)
Sodium: 140 mmol/L (ref 134–144)

## 2020-01-17 ENCOUNTER — Telehealth: Payer: Self-pay | Admitting: Cardiology

## 2020-01-17 ENCOUNTER — Encounter: Payer: Self-pay | Admitting: *Deleted

## 2020-01-17 NOTE — Telephone Encounter (Signed)
Manuel Wood is returning the phone call in regards to his lab results.

## 2020-01-17 NOTE — Telephone Encounter (Signed)
Attempted to contact pt and went straight to VM.  VM full.  Unable to leave message.

## 2020-01-18 ENCOUNTER — Telehealth (HOSPITAL_COMMUNITY): Payer: Self-pay | Admitting: Emergency Medicine

## 2020-01-18 NOTE — Telephone Encounter (Signed)
Spoke with wife who answered the phone, patient not in, encouraged him to review instructions and give me a call back with questions, if any.   She appreciated the call/reminder.  Rockwell Alexandria RN Navigator Cardiac Imaging [facility] [phone] Office  [phone] Cell

## 2020-01-20 ENCOUNTER — Other Ambulatory Visit: Payer: Self-pay

## 2020-01-20 ENCOUNTER — Ambulatory Visit (HOSPITAL_COMMUNITY)
Admission: RE | Admit: 2020-01-20 | Discharge: 2020-01-20 | Disposition: A | Discharge status: Home / Self Care | Payer: BC Managed Care – PPO | Source: Ambulatory Visit | Attending: Cardiology | Admitting: Cardiology

## 2020-01-20 ENCOUNTER — Encounter: Payer: BC Managed Care – PPO | Admitting: *Deleted

## 2020-01-20 ENCOUNTER — Encounter (HOSPITAL_COMMUNITY): Payer: Self-pay

## 2020-01-20 DIAGNOSIS — R0789 Other chest pain: Secondary | ICD-10-CM

## 2020-01-20 DIAGNOSIS — Z006 Encounter for examination for normal comparison and control in clinical research program: Secondary | ICD-10-CM

## 2020-01-20 DIAGNOSIS — R079 Chest pain, unspecified: Secondary | ICD-10-CM | POA: Insufficient documentation

## 2020-01-20 MED ORDER — NITROGLYCERIN 0.4 MG SL SUBL
0.8000 mg | SUBLINGUAL_TABLET | Freq: Once | SUBLINGUAL | Status: AC
  Administered 2020-01-20: 0.8 mg via SUBLINGUAL

## 2020-01-20 MED ORDER — METOPROLOL TARTRATE 5 MG/5ML IV SOLN
INTRAVENOUS | Status: AC
  Administered 2020-01-20: 5 mg
  Filled 2020-01-20: qty 5

## 2020-01-20 MED ORDER — METOPROLOL TARTRATE 5 MG/5ML IV SOLN: 5.0000 mg | Freq: Once | INTRAVENOUS | Status: DC

## 2020-01-20 MED ORDER — IOHEXOL 350 MG/ML SOLN
100.0000 mL | Freq: Once | INTRAVENOUS | Status: AC | PRN
  Administered 2020-01-20: 80 mL via INTRAVENOUS

## 2020-01-20 MED ORDER — NITROGLYCERIN 0.4 MG SL SUBL
SUBLINGUAL_TABLET | SUBLINGUAL | Status: AC
  Filled 2020-01-20: qty 2

## 2020-01-20 NOTE — Telephone Encounter (Signed)
Pt advised his BMET results.

## 2020-01-20 NOTE — Research (Signed)
CADFEM Informed Consent   Subject Name: Manuel Wood  Subject met inclusion and exclusion criteria.  The informed consent form, study requirements and expectations were reviewed with the subject and questions and concerns were addressed prior to the signing of the consent form.  The subject verbalized understanding of the trial requirements.  The subject agreed to participate in the CADFEM trial and signed the informed consent at 0800 on 01/20/2020.  The informed consent was obtained prior to performance of any protocol-specific procedures for the subject.  A copy of the signed informed consent was given to the subject and a copy was placed in the subject's medical record.   Oletta Cohn.

## 2020-03-09 ENCOUNTER — Other Ambulatory Visit: Payer: Self-pay

## 2020-03-09 ENCOUNTER — Encounter: Payer: Self-pay | Admitting: Cardiology

## 2020-03-09 ENCOUNTER — Ambulatory Visit: Payer: BC Managed Care – PPO | Admitting: Cardiology

## 2020-03-09 VITALS — BP 128/78 | HR 76 | Ht 72.0 in | Wt 392.0 lb

## 2020-03-09 DIAGNOSIS — R0789 Other chest pain: Secondary | ICD-10-CM | POA: Diagnosis not present

## 2020-03-09 DIAGNOSIS — I10 Essential (primary) hypertension: Secondary | ICD-10-CM

## 2020-03-09 NOTE — Patient Instructions (Signed)

## 2020-03-09 NOTE — Progress Notes (Signed)
Cardiology Office Note:    Date:  03/09/2020   ID:  Manuel Wood, DOB 06-19-1986, MRN [id number]  PCP:  Marco Collie, MD  Cardiologist:  Jenne Campus, MD    Referring MD: Marco Collie, MD   No chief complaint on file. Doing well  History of Present Illness:    Manuel Wood is a 34 y.o. male who was admitted to the hospital because of atypical chest pain after COVID-19 infection.  He did have a stress test done in the hospital which got very unclear report with some questions about potentially apex.  After that I did cause some scar as well as CT coronary angio which showed calcium score of 0 but there was some artifact involving midportion of circumflex artery with some question about potentially soft plaque over there.  He is doing much better he said since I have seen him last time he got only 1 episode of left shoulder pain not related to exercise.  His pain was very atypical from the beginning.  He does have some risk factors for coronary artery disease namely obesity as well as essential hypertension.  No past medical history on file.  Past Surgical History:  Procedure Laterality Date  . CHOLECYSTECTOMY    . LAPAROSCOPIC GASTRIC BANDING  2011    Current Medications: Current Meds  Medication Sig  . escitalopram (LEXAPRO) 20 MG tablet Take 20 mg by mouth daily.  [name] lisinopril (ZESTRIL) 10 MG tablet Take 10 mg by mouth daily.  . metoprolol succinate (TOPROL-XL) 100 MG 24 hr tablet Take 100 mg by mouth daily.  . [DISCONTINUED] escitalopram (LEXAPRO) 10 MG tablet Take 10 mg by mouth daily.     Allergies:   Patient has no known allergies.   Social History   Socioeconomic History  . Marital status: Single    Spouse name: Not on file  . Number of children: Not on file  . Years of education: Not on file  . Highest education level: Not on file  Occupational History  . Not on file  Tobacco Use  . Smoking status: Never Smoker  . Smokeless tobacco: Never Used  Substance and  Sexual Activity  . Alcohol use: No  . Drug use: No  . Sexual activity: Not on file  Other Topics Concern  . Not on file  Social History Narrative  . Not on file   Social Determinants of Health   Financial Resource Strain:   . Difficulty of Paying Living Expenses:   Food Insecurity:   . Worried About Charity fundraiser in the Last Year:   . Arboriculturist in the Last Year:   Transportation Needs:   . Film/video editor (Medical):   [name] Lack of Transportation (Non-Medical):   Physical Activity:   . Days of Exercise per Week:   . Minutes of Exercise per Session:   Stress:   . Feeling of Stress :   Social Connections:   . Frequency of Communication with Friends and Family:   . Frequency of Social Gatherings with Friends and Family:   . Attends Religious Services:   . Active Member of Clubs or Organizations:   . Attends Archivist Meetings:   [name] Marital Status:      Family History: The patient's family history includes Cancer in his father. ROS:   Please see the history of present illness.    All 14 point review of systems negative except as described per history of present illness  EKGs/Labs/Other Studies Reviewed:    Stress test done in hospital showed: IMPRESSION: 1. Matched decreased counts to the apical segment of the anterior septal wall. Differential includes focus of infarction with mild peri-infarct ischemia versus benign RV insertion site.  Matched decreased perfusion to the inferior wall is favored diaphragmatic attenuation.  2. Normal left ventricular wall motion.  3. Left ventricular ejection fraction 56%  4. Non invasive risk stratification*: Low   Recent Labs: 10/04/2019: ALT 57 10/07/2019: Hemoglobin 15.0; Platelets 277 01/15/2020: BUN 10; Creatinine, Ser 0.95; Potassium 4.0; Sodium 140  Recent Lipid Panel    Component Value Date/Time   CHOL 169 03/14/2011 2010   TRIG 128 03/14/2011 2010   HDL 32 (L) 03/14/2011 2010   CHOLHDL  5.3 03/14/2011 2010   VLDL 26 03/14/2011 2010   LDLCALC (H) 03/14/2011 2010    111        Total Cholesterol/HDL:CHD Risk Coronary Heart Disease Risk Table                     Men   Women  1/2 Average Risk   3.4   3.3  Average Risk       5.0   4.4  2 X Average Risk   9.6   7.1  3 X Average Risk  23.4   11.0        Use the calculated Patient Ratio above and the CHD Risk Table to determine the patient's CHD Risk.        ATP III CLASSIFICATION (LDL):  <100     mg/dL   Optimal  497-026  mg/dL   Near or Above                    Optimal  130-159  mg/dL   Borderline  378-588  mg/dL   High  >502     mg/dL   Very High    Physical Exam:    VS:  BP 128/78   Pulse 76   Ht 6' (1.829 m)   Wt (!) 392 lb (177.8 kg)   SpO2 97%   BMI 53.16 kg/m     Wt Readings from Last 3 Encounters:  03/09/20 (!) 392 lb (177.8 kg)  12/27/19 (!) 383 lb 12.8 oz (174.1 kg)  10/07/19 (!) 365 lb (165.6 kg)     GEN:  Well nourished, well developed in no acute distress HEENT: Normal NECK: No JVD; No carotid bruits LYMPHATICS: No lymphadenopathy CARDIAC: RRR, no murmurs, no rubs, no gallops RESPIRATORY:  Clear to auscultation without rales, wheezing or rhonchi  ABDOMEN: Soft, non-tender, non-distended MUSCULOSKELETAL:  No edema; No deformity  SKIN: Warm and dry LOWER EXTREMITIES: no swelling NEUROLOGIC:  Alert and oriented x 3 PSYCHIATRIC:  Normal affect   ASSESSMENT:    1. Atypical chest pain   2. Essential hypertension   3. Morbid obesity ([facility])    PLAN:    In order of problems listed above:  1. Atypical chest pain.  I did review his echo stress test from the hospital shows a questionable ischemia involving apex, he is a coronary CT angio showed some artifact in the midportion of the circumflex artery raising suspicion for potentially soft plaque.  However this area was perfectly normal on day stress test.  Therefore: I do not think we dealing with any obstructive coronary disease.  The key  will be risk factors modifications. 2. Essential hypertension blood pressure well controlled continue present management. 3. Morbid obesity doing  well from that point review. 4. He was put on Abilify after Covid he stopped this medication he is doing much better now. 5. We will schedule him to have fasting lipid profile done to risk assessed   Medication Adjustments/Labs and Tests Ordered: Current medicines are reviewed at length with the patient today.  Concerns regarding medicines are outlined above.  No orders of the defined types were placed in this encounter.  Medication changes: No orders of the defined types were placed in this encounter.   Signed, Georgeanna Lea, MD, [facility] 03/09/2020 2:37 PM    [facility]

## 2022-11-01 ENCOUNTER — Other Ambulatory Visit (HOSPITAL_COMMUNITY): Payer: Self-pay | Admitting: Surgery

## 2022-11-01 DIAGNOSIS — Z01818 Encounter for other preprocedural examination: Secondary | ICD-10-CM

## 2022-11-11 ENCOUNTER — Ambulatory Visit: Payer: Self-pay | Admitting: Surgery

## 2022-11-17 ENCOUNTER — Ambulatory Visit (HOSPITAL_COMMUNITY)
Admission: RE | Admit: 2022-11-17 | Discharge: 2022-11-17 | Disposition: A | Discharge status: Home / Self Care | Payer: BC Managed Care – PPO | Source: Ambulatory Visit | Attending: Surgery | Admitting: Surgery

## 2022-11-17 ENCOUNTER — Encounter: Payer: BC Managed Care – PPO | Attending: Surgery | Admitting: Dietician

## 2022-11-17 ENCOUNTER — Encounter: Payer: Self-pay | Admitting: Dietician

## 2022-11-17 VITALS — Ht 72.0 in | Wt >= 6400 oz

## 2022-11-17 DIAGNOSIS — Z01818 Encounter for other preprocedural examination: Secondary | ICD-10-CM | POA: Diagnosis present

## 2022-11-17 DIAGNOSIS — E669 Obesity, unspecified: Secondary | ICD-10-CM | POA: Diagnosis not present

## 2022-11-17 NOTE — Progress Notes (Addendum)
Nutrition Assessment for Bariatric Surgery Medical Nutrition Therapy Appt Start Time: 8:40    End Time: 9:37  Patient was seen on 11/17/2022 for Pre-Operative Nutrition Assessment. Letter of approval faxed to [facility] bariatric surgery program coordinator on 11/17/2022.   Referral stated Supervised Weight Loss (SWL) visits needed: 0  Pt completed visits.   Pt has cleared nutrition requirements.   Planned surgery: RYGB (w/ Band Removal)   NUTRITION ASSESSMENT   Anthropometrics  Start weight at [facility]: 447.5 lbs (date: 11/17/2022)  Height: 72 in BMI: 60.69 kg/m2     Clinical  Medical hx: obesity, HTN, sleep apnea Medications: Lisinopril, escitalopram, metoprolol  Labs: no recent labs in EMR Notable signs/symptoms: none noted Any previous deficiencies? No  Micronutrient Nutrition Focused Physical Exam: Hair: No issues observed Eyes: No issues observed Mouth: No issues observed Neck: No issues observed Nails: No issues observed Skin: No issues observed  Lifestyle & Dietary Hx  Patient lives alone, two 76 yards from parents. Mr. Patient performs the food shopping and prepares the meals. He reports that he typically skips or misses 5 out of 21 possible meals per week. He may have 3 meals per week that are take-out or at a restaurant.  Patient works as a Physiological scientist. He denies binge eating though has felt shame and/or guilt after eating too much food.  He denies having used laxatives or vomiting to facilitate weight loss. He denies emotional eating during times of stress. He states that he knows the difference between hunger and thirst and can tell when he is full. Pt reports his blood pressure is monitored by PCP and states his PCP monitors other labs and Cholesterol is in control.  Physical Activity: ADLs; hunting and fishing  Sleep Hygiene: duration and quality: 7-8 hours a night; with CPAP, good quality  Current Patient Perceived Stress Level as  stated by pt on a scale of 1-10: 3-4       Stress Management Techniques: breathing, talking, reach out to his boss  Fruit servings per week: 3-4 Non starchy vegetable servings per week: 14 Whole Grains per week: 3-4  24-Hr Dietary Recall First Meal: skip or honey nut cheerios with skim milk Snack:  Second Meal: grilled chicken salad, broccoli cheddar soup Snack:  Third Meal: chicken fajitas Snack:  Beverages: water, Gatorade or Gatorade Zero, tea (half and half sweet)  Alcoholic beverages per week: 0-1   Estimated Energy Needs Calories: 2000   NUTRITION DIAGNOSIS  Overweight/obesity ([place]-3.3) related to past poor dietary habits and physical inactivity as evidenced by patient w/ planned RYGB (band removal) surgery following dietary guidelines for continued weight loss.   NUTRITION INTERVENTION  Nutrition counseling (C-1) and education (E-2) to facilitate bariatric surgery goals.  Educated pt on micronutrient deficiencies post surgery and strategies to mitigate that risk   Pre-Op Goals Reviewed with the Patient Track food and beverage intake (pen and paper, MyFitness Pal, Baritastic app, etc.) Make healthy food choices while monitoring portion sizes Consume 3 meals per day or try to eat every 3-5 hours Avoid concentrated sugars and fried foods Keep sugar & fat in the single digits per serving on food labels Practice CHEWING your food (aim for applesauce consistency) Practice not drinking 15 minutes before, during, and 30 minutes after each meal and snack Avoid all carbonated beverages (ex: soda, sparkling beverages)  Limit caffeinated beverages (ex: coffee, tea, energy drinks) Avoid all sugar-sweetened beverages (ex: regular soda, sports drinks)  Avoid alcohol  Aim for 64-100 ounces  of FLUID daily (with at least half of fluid intake being plain water)  Aim for at least 60-80 grams of PROTEIN daily Look for a liquid protein source that contains ?15 g protein and ?5 g  carbohydrate (ex: shakes, drinks, shots) Make a list of non-food related activities Physical activity is an important part of a healthy lifestyle so keep it moving! The goal is to reach 150 minutes of exercise per week, including cardiovascular and weight baring activity.  *Goals that are bolded indicate the pt would like to start working towards these  Handouts Provided Include  Bariatric Surgery handouts (Nutrition Visits, Pre-Op Goals, Protein Shakes, Vitamins & Minerals)  Learning Style & Readiness for Change Teaching method utilized: Visual & Auditory  Demonstrated degree of understanding via: Teach Back  Readiness Level: Preparation Barriers to learning/adherence to lifestyle change: nothing identified  RD's Notes for Next Visit     MONITORING & EVALUATION Dietary intake, weekly physical activity, body weight, and pre-op goals reached at next nutrition visit.    Next Steps  Pt has completed visits. No further supervised visits required/recomended  Patient is to follow up at [facility] for Pre-Op Class >2 weeks before surgery for further nutrition education.

## 2023-01-04 ENCOUNTER — Encounter (HOSPITAL_COMMUNITY): Payer: Self-pay | Admitting: Surgery

## 2023-01-10 ENCOUNTER — Ambulatory Visit (HOSPITAL_COMMUNITY): Payer: BC Managed Care – PPO | Admitting: Certified Registered Nurse Anesthetist

## 2023-01-10 ENCOUNTER — Ambulatory Visit (HOSPITAL_COMMUNITY)
Admission: RE | Admit: 2023-01-10 | Discharge: 2023-01-10 | Disposition: A | Discharge status: Home / Self Care | Payer: BC Managed Care – PPO | Source: Ambulatory Visit | Attending: Surgery | Admitting: Surgery

## 2023-01-10 ENCOUNTER — Encounter (HOSPITAL_COMMUNITY): Payer: Self-pay | Admitting: Surgery

## 2023-01-10 ENCOUNTER — Other Ambulatory Visit: Payer: Self-pay

## 2023-01-10 ENCOUNTER — Encounter (HOSPITAL_COMMUNITY)
Admission: RE | Disposition: A | Discharge status: Home / Self Care | Payer: Self-pay | Source: Ambulatory Visit | Attending: Surgery

## 2023-01-10 DIAGNOSIS — G4733 Obstructive sleep apnea (adult) (pediatric): Secondary | ICD-10-CM | POA: Insufficient documentation

## 2023-01-10 DIAGNOSIS — K2950 Unspecified chronic gastritis without bleeding: Secondary | ICD-10-CM | POA: Diagnosis not present

## 2023-01-10 DIAGNOSIS — I10 Essential (primary) hypertension: Secondary | ICD-10-CM | POA: Diagnosis not present

## 2023-01-10 DIAGNOSIS — Z9884 Bariatric surgery status: Secondary | ICD-10-CM | POA: Diagnosis not present

## 2023-01-10 DIAGNOSIS — Z6841 Body Mass Index (BMI) 40.0 and over, adult: Secondary | ICD-10-CM | POA: Diagnosis not present

## 2023-01-10 DIAGNOSIS — K219 Gastro-esophageal reflux disease without esophagitis: Secondary | ICD-10-CM | POA: Diagnosis not present

## 2023-01-10 DIAGNOSIS — K319 Disease of stomach and duodenum, unspecified: Secondary | ICD-10-CM | POA: Diagnosis not present

## 2023-01-10 HISTORY — DX: Anxiety disorder, unspecified: F41.9

## 2023-01-10 HISTORY — PX: BIOPSY: SHX5522

## 2023-01-10 HISTORY — PX: ESOPHAGOGASTRODUODENOSCOPY: SHX5428

## 2023-01-10 HISTORY — DX: Essential (primary) hypertension: I10

## 2023-01-10 SURGERY — EGD (ESOPHAGOGASTRODUODENOSCOPY)
Anesthesia: Monitor Anesthesia Care

## 2023-01-10 MED ORDER — LACTATED RINGERS IV SOLN: Freq: Once | INTRAVENOUS | Status: AC

## 2023-01-10 MED ORDER — ONDANSETRON HCL 4 MG/2ML IJ SOLN
INTRAMUSCULAR | Status: DC | PRN
  Administered 2023-01-10: 4 mg via INTRAVENOUS

## 2023-01-10 MED ORDER — PROPOFOL 1000 MG/100ML IV EMUL
INTRAVENOUS | Status: AC
  Filled 2023-01-10: qty 100

## 2023-01-10 MED ORDER — ONDANSETRON HCL 4 MG/2ML IJ SOLN: 4.0000 mg | Freq: Once | INTRAMUSCULAR | Status: DC | PRN

## 2023-01-10 MED ORDER — AMISULPRIDE (ANTIEMETIC) 5 MG/2ML IV SOLN: 10.0000 mg | Freq: Once | INTRAVENOUS | Status: DC | PRN

## 2023-01-10 MED ORDER — PROPOFOL 500 MG/50ML IV EMUL
INTRAVENOUS | Status: AC
  Filled 2023-01-10: qty 50

## 2023-01-10 MED ORDER — PROPOFOL 500 MG/50ML IV EMUL
INTRAVENOUS | Status: DC | PRN
  Administered 2023-01-10: 150 ug/kg/min via INTRAVENOUS

## 2023-01-10 MED ORDER — LACTATED RINGERS IV SOLN: INTRAVENOUS | Status: DC | PRN

## 2023-01-10 MED ORDER — PROPOFOL 10 MG/ML IV BOLUS
INTRAVENOUS | Status: DC | PRN
  Administered 2023-01-10: 30 mg via INTRAVENOUS
  Administered 2023-01-10: 70 mg via INTRAVENOUS
  Administered 2023-01-10: 20 mg via INTRAVENOUS

## 2023-01-10 MED ORDER — LIDOCAINE 2% (20 MG/ML) 5 ML SYRINGE
INTRAMUSCULAR | Status: DC | PRN
  Administered 2023-01-10: 100 mg via INTRAVENOUS

## 2023-01-10 NOTE — Anesthesia Postprocedure Evaluation (Signed)
Anesthesia Post Note  Patient: Manuel Wood  Procedure(s) Performed: ESOPHAGOGASTRODUODENOSCOPY (EGD) BIOPSY     Patient location during evaluation: PACU Anesthesia Type: MAC Level of consciousness: awake and alert Pain management: pain level controlled Vital Signs Assessment: post-procedure vital signs reviewed and stable Respiratory status: spontaneous breathing, nonlabored ventilation, respiratory function stable and patient connected to nasal cannula oxygen Cardiovascular status: stable and blood pressure returned to baseline Postop Assessment: no apparent nausea or vomiting Anesthetic complications: no  No notable events documented.  Last Vitals:  Vitals:   01/10/23 1313 01/10/23 1320  BP: (!) 174/102 (!) 170/88  Pulse: 69 67  Resp: 19 13  Temp:    SpO2: 97% 98%    Last Pain:  Vitals:   01/10/23 1320  TempSrc:   PainSc: 0-No pain                 Effie Berkshire

## 2023-01-10 NOTE — Op Note (Signed)
[facility] Patient Name: Navarro Brancato Procedure Date: 01/10/2023 MRN: [id number] Attending MD: Felicie Morn , ,  Date of Birth: [birth year] CSN: [id number] Age: 37 Admit Type: Outpatient Procedure:                Upper GI endoscopy Indications:               Providers:                Felicie Morn, Vladimir Crofts, RN, Darliss Cheney,                            Technician, Heide Scales, CRNA Referring MD:              Medicines:                Monitored Anesthesia Care Complications:            No immediate complications. Estimated blood loss:                            Minimal. Estimated Blood Loss:     Estimated blood loss was minimal. Procedure:                Pre-Anesthesia Assessment:                           - Prior to the procedure, a History and Physical                            was performed, and patient medications and                            allergies were reviewed. The patient is competent.                            The risks and benefits of the procedure and the                            sedation options and risks were discussed with the                            patient. All questions were answered and informed                            consent was obtained. Patient identification and                            proposed procedure were verified by the physician                            in the pre-procedure area. Mental Status                            Examination: alert and oriented. Airway                            Examination: normal oropharyngeal  airway and neck                            mobility. Respiratory Examination: clear to                            auscultation. CV Examination: normal. ASA Grade                            Assessment: II - A patient with mild systemic                            disease. After reviewing the risks and benefits,                            the patient was deemed in satisfactory condition to                             undergo the procedure. The anesthesia plan was to                            use monitored anesthesia care (MAC). Immediately                            prior to administration of medications, the patient                            was re-assessed for adequacy to receive sedatives.                            The heart rate, respiratory rate, oxygen                            saturations, blood pressure, adequacy of pulmonary                            ventilation, and response to care were monitored                            throughout the procedure. The physical status of                            the patient was re-assessed after the procedure.                           After obtaining informed consent, the endoscope was                            passed under direct vision. Throughout the                            procedure, the patient's blood pressure, pulse, and  oxygen saturations were monitored continuously. The                            [id number] Olympus endoscope was introduced                            through the mouth, and advanced to the second part                            of duodenum. The upper GI endoscopy was                            accomplished without difficulty. The patient                            tolerated the procedure well. Scope In: Scope Out: Findings:      The lower third of the esophagus was moderately tortuous.      The entire examined stomach was normal. Biopsies were taken with a cold       forceps for Helicobacter pylori testing. Verification of patient       identification for the specimen was done. Estimated blood loss was       minimal.      Evidence of a patent vertical banded gastroplasty was found. A gastric       pouch with a normal size was found. Evidence of a Silastic band was not       seen and appeared loose. This was traversed.      The examined duodenum was  normal. Impression:               - Tortuous esophagus.                           - Normal stomach. Biopsied.                           - Patent vertical banded gastroplasty with a                            normal-sized pouch and band appears loose.                           - Normal examined duodenum. Moderate Sedation:      Moderate (conscious) sedation was personally administered by an       anesthesia professional. The following parameters were monitored: oxygen       saturation, heart rate, blood pressure, and response to care. Total       physician intraservice time was 15 minutes. Recommendation:           - Discharge patient to home.                           - Resume previous diet.                           - Continue present medications.                           -  Await pathology results. Procedure Code(s):        --- Professional ---                           [phone], Esophagogastroduodenoscopy, flexible,                            transoral; with biopsy, single or multiple Diagnosis Code(s):        --- Professional ---                           Z98.84, Bariatric surgery status CPT copyright 2022 American [employer] Association. All rights reserved. The codes documented in this report are preliminary and upon coder review may  be revised to meet current compliance requirements. [place],  01/10/2023 12:57:14 PM This report has been signed electronically. Number of Addenda: 0

## 2023-01-10 NOTE — Anesthesia Procedure Notes (Signed)
Procedure Name: MAC Date/Time: 01/10/2023 12:36 PM  Performed by: Deliah Boston, CRNAPre-anesthesia Checklist: Patient identified, Emergency Drugs available, Suction available and Patient being monitored Patient Re-evaluated:Patient Re-evaluated prior to induction Oxygen Delivery Method: Nasal cannula Preoxygenation: Pre-oxygenation with 100% oxygen Placement Confirmation: positive ETCO2 and breath sounds checked- equal and bilateral Comments: Optiflow nasal cannula used

## 2023-01-10 NOTE — Transfer of Care (Signed)
Immediate Anesthesia Transfer of Care Note  Patient: Manuel Wood  Procedure(s) Performed: Procedure(s): ESOPHAGOGASTRODUODENOSCOPY (EGD) (N/A) BIOPSY  Patient Location: PACU  Anesthesia Type:MAC  Level of Consciousness: Patient easily awoken, sedated, comfortable, cooperative, following commands, responds to stimulation.   Airway & Oxygen Therapy: Patient spontaneously breathing, ventilating well, oxygen via simple oxygen mask.  Post-op Assessment: Report given to PACU RN, vital signs reviewed and stable, moving all extremities.   Post vital signs: Reviewed and stable.  Complications: No apparent anesthesia complications  Last Vitals:  Vitals Value Taken Time  BP 168/78 01/10/23 1300  Temp 36.6 C 01/10/23 1257  Pulse 75 01/10/23 1303  Resp 15 01/10/23 1303  SpO2 99 % 01/10/23 1303  Vitals shown include unvalidated device data.  Last Pain:  Vitals:   01/10/23 1300  TempSrc:   PainSc: 0-No pain         Complications: No notable events documented.

## 2023-01-10 NOTE — Anesthesia Preprocedure Evaluation (Addendum)
Anesthesia Evaluation  Patient identified by MRN, date of birth, ID band Patient awake    Reviewed: Allergy & Precautions, NPO status , Patient's Chart, lab work & pertinent test results, reviewed documented beta blocker date and time   Airway Mallampati: II  TM Distance: >3 FB Neck ROM: Full    Dental  (+) Teeth Intact, Dental Advisory Given   Pulmonary neg pulmonary ROS   breath sounds clear to auscultation       Cardiovascular hypertension, Pt. on medications and Pt. on home beta blockers  Rhythm:Regular Rate:Normal     Neuro/Psych negative neurological ROS  negative psych ROS   GI/Hepatic negative GI ROS, Neg liver ROS,,,  Endo/Other  negative endocrine ROS    Renal/GU negative Renal ROS     Musculoskeletal negative musculoskeletal ROS (+)    Abdominal  (+) + obese  Peds  Hematology negative hematology ROS (+)   Anesthesia Other Findings   Reproductive/Obstetrics                             Anesthesia Physical Anesthesia Plan  ASA: 3  Anesthesia Plan: MAC   Post-op Pain Management: Minimal or no pain anticipated   Induction: Intravenous  PONV Risk Score and Plan: 0 and Propofol infusion  Airway Management Planned: Natural Airway and Simple Face Mask  Additional Equipment: None  Intra-op Plan:   Post-operative Plan:   Informed Consent: I have reviewed the patients History and Physical, chart, labs and discussed the procedure including the risks, benefits and alternatives for the proposed anesthesia with the patient or authorized representative who has indicated his/her understanding and acceptance.       Plan Discussed with: CRNA  Anesthesia Plan Comments:        Anesthesia Quick Evaluation

## 2023-01-10 NOTE — H&P (Signed)
Admitting Physician: Nickola Major Aasia Peavler  Service: Bariatric Surgery  CC: Obesity  Subjective   HPI: Manuel Wood is an 37 y.o. male who is here for preoperative EGD prior to bariatric surgery.  History reviewed. No pertinent past medical history.  Past Surgical History:  Procedure Laterality Date   CHOLECYSTECTOMY     LAPAROSCOPIC GASTRIC BANDING  2011    Family History  Problem Relation Age of Onset   Cancer Father        prostate    Social:  reports that he has never smoked. He has never used smokeless tobacco. He reports that he does not drink alcohol and does not use drugs.  Allergies: No Known Allergies  Medications: Current Outpatient Medications  Medication Instructions   escitalopram (LEXAPRO) 20 mg, Oral, Daily   fenofibrate micronized (LOFIBRA) 134 mg, Oral, Daily   lisinopril (ZESTRIL) 10 mg, Oral, Daily   metoprolol succinate (TOPROL-XL) 100 mg, Oral, Daily    ROS - all of the below systems have been reviewed with the patient and positives are indicated with bold text General: chills, fever or night sweats Eyes: blurry vision or double vision ENT: epistaxis or sore throat Allergy/Immunology: itchy/watery eyes or nasal congestion Hematologic/Lymphatic: bleeding problems, blood clots or swollen lymph nodes Endocrine: temperature intolerance or unexpected weight changes Breast: new or changing breast lumps or nipple discharge Resp: cough, shortness of breath, or wheezing CV: chest pain or dyspnea on exertion GI: as per HPI GU: dysuria, trouble voiding, or hematuria MSK: joint pain or joint stiffness Neuro: TIA or stroke symptoms Derm: pruritus and skin lesion changes Psych: anxiety and depression  Objective   PE There were no vitals taken for this visit. Constitutional: NAD; conversant; no deformities Eyes: Moist conjunctiva; no lid lag; anicteric; PERRL Neck: Trachea midline; no thyromegaly Lungs: Normal respiratory effort; no tactile  fremitus CV: RRR; no palpable thrills; no pitting edema GI: Abd Soft, nontender; no palpable hepatosplenomegaly MSK: Normal range of motion of extremities; no clubbing/cyanosis Psychiatric: Appropriate affect; alert and oriented x3 Lymphatic: No palpable cervical or axillary lymphadenopathy  No results found for this or any previous visit (from the past 24 hour(s)).  Imaging Orders  No imaging studies ordered today     Assessment and Plan   Morbid obesity with body mass index (BMI) of 60.0 to 69.9 in adult (CMS-HCC)  Hx of laparoscopic adjustable gastric banding  Hypertension, unspecified type  Obstructive sleep apnea  Hx of hiatal hernia  Preoperative testing  Manuel Wood is a 37 y.o. male with a history of laparoscopic adjustable gastric band placement in 2011, who is seen for revision bariatric surgery consultation. The patient has morbid obesity with a BMI of Body mass index is 60.84 kg/m. and the following conditions related to obesity: Hypertension, obstructive sleep apnea.  Today we discussed the surgical options to treat obesity and its associated comorbidity. After discussing the available procedures in the region, we discussed in great detail the surgeries I offer for revision of lap band: robotic sleeve gastrectomy and robotic roux-en-y gastric bypass. We discussed the procedures themselves as well as their risks, benefits and alternatives. I entered the patient's basic information into the [facility] Metabolic Surgery Risk/Benefit Calculator to facilitate this discussion.   After a full discussion and all questions answered, the patient is interested in pursuing a robotic conversion of adjustable gastric band to gastric bypass with upper endoscopy.  We will initiate the bariatric surgery preoperative pathway to include the following: - Bloodwork - Dietician consult -  Chest x-ray - EKG - Psychology evaluation - Upper endoscopy with biopsy. I explained my rational for  performing upper endoscopy in my bariatric patients. During the procedure I will biopsy for H. Pylori, evaluate for hiatal hernia, evaluate for reflux esophagitis and look for any other abnormalities that may influence the procedure. We discussed the risks, benefits and alternative to this procedure and the patient granted consent to proceed.   Today he presents for EGD.  We will proceed as scheduled.   Felicie Morn, MD  [facility] Use [email or web address] to contact on call provider

## 2023-01-11 LAB — SURGICAL PATHOLOGY

## 2023-01-15 ENCOUNTER — Encounter (HOSPITAL_COMMUNITY): Payer: Self-pay | Admitting: Surgery

## 2023-02-13 ENCOUNTER — Encounter: Payer: BC Managed Care – PPO | Attending: Surgery | Admitting: Skilled Nursing Facility1

## 2023-02-14 ENCOUNTER — Encounter: Payer: Self-pay | Admitting: Skilled Nursing Facility1

## 2023-02-14 NOTE — Progress Notes (Signed)
Pre-Operative Nutrition Class:    Patient was seen on 02/13/2023 for Pre-Operative Bariatric Surgery Education at [facility]    Surgery date: 02/28/2023 Surgery type: RYGB Start weight at [facility]: 447.5 Weight today: 450.3   The following the learning objectives were met by the patient during this course: Identify Pre-Op Dietary Goals and will begin 2 weeks pre-operatively Identify appropriate sources of fluids and proteins  State protein recommendations and appropriate sources pre and post-operatively Identify Post-Operative Dietary Goals and will follow for 2 weeks post-operatively Identify appropriate multivitamin and calcium sources Describe the need for physical activity post-operatively and will follow MD recommendations State when to call healthcare provider regarding medication questions or post-operative complications When having a diagnosis of diabetes understanding hypoglycemia symptoms and the inclusion of 1 complex carbohydrate per meal  Handouts given during class include: Pre-Op Bariatric Surgery Diet Handout Protein Shake Handout Post-Op Bariatric Surgery Nutrition Handout BELT Program Information Flyer Support Group Information Flyer [facility] Bariatric Supplements Price List  Follow-Up Plan: Patient will follow-up at [facility] 2 weeks post operatively for diet advancement per MD.

## 2023-02-14 NOTE — Patient Instructions (Signed)
SURGICAL WAITING ROOM VISITATION  Patients having surgery or a procedure may have no more than 2 support people in the waiting area - these visitors may rotate.    Children under the age of 40 must have an adult with them who is not the patient.  Due to an increase in RSV and influenza rates and associated hospitalizations, children ages 59 and under may not visit patients in [facility] hospitals.  If the patient needs to stay at the hospital during part of their recovery, the visitor guidelines for inpatient rooms apply. Pre-op nurse will coordinate an appropriate time for 1 support person to accompany patient in pre-op.  This support person may not rotate.    Please refer to the [facility] website for the visitor guidelines for Inpatients (after your surgery is over and you are in a regular room).       Your procedure is scheduled on:  02/28/23    Report to [facility] Main Entrance    Report to admitting at  0515 AM   Call this number if you have problems the morning of surgery [phone]   Do not eat food :After Midnight.   After Midnight you may have the following liquids until ______ AM/ PM DAY OF SURGERY  Water Non-Citrus Juices (without pulp, NO RED-Apple, White grape, White cranberry) Black Coffee (NO MILK/CREAM OR CREAMERS, sugar ok)  Clear Tea (NO MILK/CREAM OR CREAMERS, sugar ok) regular and decaf                             Plain Jell-O (NO RED)                                           Fruit ices (not with fruit pulp, NO RED)                                     Popsicles (NO RED)                                                               Sports drinks like Gatorade (NO RED)                     The day of surgery:  Drink ONE (1) Pre-Surgery Clear Ensure or G2 at AM the morning of surgery. Drink in one sitting. Do not sip.  This drink was given to you during your hospital  pre-op appointment visit. Nothing else to drink after completing the   Pre-Surgery Clear Ensure or G2.          If you have questions, please contact your surgeon's office.       Oral Hygiene is also important to reduce your risk of infection.                                    Remember - BRUSH YOUR TEETH THE MORNING OF SURGERY WITH YOUR REGULAR TOOTHPASTE  DENTURES WILL BE  REMOVED PRIOR TO SURGERY PLEASE DO NOT APPLY "Poly grip" OR ADHESIVES!!!   Do NOT smoke after Midnight   Take these medicines the morning of surgery with A SIP OF WATER:  lexapro, toprol   DO NOT TAKE ANY ORAL DIABETIC MEDICATIONS DAY OF YOUR SURGERY  Bring CPAP mask and tubing day of surgery.                              You may not have any metal on your body including hair pins, jewelry, and body piercing             Do not wear make-up, lotions, powders, perfumes/cologne, or deodorant  Do not wear nail polish including gel and S&S, artificial/acrylic nails, or any other type of covering on natural nails including finger and toenails. If you have artificial nails, gel coating, etc. that needs to be removed by a nail salon please have this removed prior to surgery or surgery may need to be canceled/ delayed if the surgeon/ anesthesia feels like they are unable to be safely monitored.   Do not shave  48 hours prior to surgery.               Men may shave face and neck.   Do not bring valuables to the hospital. [facility] IS NOT             RESPONSIBLE   FOR VALUABLES.   Contacts, glasses, dentures or bridgework may not be worn into surgery.   Bring small overnight bag day of surgery.   DO NOT BRING YOUR HOME MEDICATIONS TO THE HOSPITAL. PHARMACY WILL DISPENSE MEDICATIONS LISTED ON YOUR MEDICATION LIST TO YOU DURING YOUR ADMISSION IN THE HOSPITAL!    Patients discharged on the day of surgery will not be allowed to drive home.  Someone NEEDS to stay with you for the first 24 hours after anesthesia.   Special Instructions: Bring a copy of your healthcare power of attorney  and living will documents the day of surgery if you haven't scanned them before.              Please read over the following fact sheets you were given: IF YOU HAVE QUESTIONS ABOUT YOUR PRE-OP INSTRUCTIONS PLEASE CALL [phone]   If you received a COVID test during your pre-op visit  it is requested that you wear a mask when out in public, stay away from anyone that may not be feeling well and notify your surgeon if you develop symptoms. If you test positive for Covid or have been in contact with anyone that has tested positive in the last 10 days please notify you surgeon.    [place] - Preparing for Surgery Before surgery, you can play an important role.  Because skin is not sterile, your skin needs to be as free of germs as possible.  You can reduce the number of germs on your skin by washing with CHG (chlorahexidine gluconate) soap before surgery.  CHG is an antiseptic cleaner which kills germs and bonds with the skin to continue killing germs even after washing. Please DO NOT use if you have an allergy to CHG or antibacterial soaps.  If your skin becomes reddened/irritated stop using the CHG and inform your nurse when you arrive at Short Stay. Do not shave (including legs and underarms) for at least 48 hours prior to the first CHG shower.  You may shave your face/neck.  Please follow these instructions carefully:  1.  Shower with CHG Soap the night before surgery and the  morning of Surgery.  2.  If you choose to wash your hair, wash your hair first as usual with your  normal  shampoo.  3.  After you shampoo, rinse your hair and body thoroughly to remove the  shampoo.                           4.  Use CHG as you would any other liquid soap.  You can apply chg directly  to the skin and wash                       Gently with a scrungie or clean washcloth.  5.  Apply the CHG Soap to your body ONLY FROM THE NECK DOWN.   Do not use on face/ open                           Wound or open sores.  Avoid contact with eyes, ears mouth and genitals (private parts).                       Wash face,  Genitals (private parts) with your normal soap.             6.  Wash thoroughly, paying special attention to the area where your surgery  will be performed.  7.  Thoroughly rinse your body with warm water from the neck down.  8.  DO NOT shower/wash with your normal soap after using and rinsing off  the CHG Soap.                9.  Pat yourself dry with a clean towel.            10.  Wear clean pajamas.            11.  Place clean sheets on your bed the night of your first shower and do not  sleep with pets. Day of Surgery : Do not apply any lotions/deodorants the morning of surgery.  Please wear clean clothes to the hospital/surgery center.  FAILURE TO FOLLOW THESE INSTRUCTIONS MAY RESULT IN THE CANCELLATION OF YOUR SURGERY PATIENT SIGNATURE_________________________________  NURSE SIGNATURE__________________________________  ________________________________________________________________________

## 2023-02-14 NOTE — Progress Notes (Signed)
Anesthesia Review:  PCP: Cardiologist : Chest x-ray : 11/17/22- 2 view  EKG : 11/17/22  CT Cors- 2021  Echo : Stress test: Cardiac Cath :  Activity level:  Sleep Study/ CPAP : Fasting Blood Sugar :      / Checks Blood Sugar -- times a day:   Blood Thinner/ Instructions /Last Dose: ASA / Instructions/ Last Dose :

## 2023-02-16 ENCOUNTER — Other Ambulatory Visit: Payer: Self-pay

## 2023-02-16 ENCOUNTER — Encounter (HOSPITAL_COMMUNITY)
Admission: RE | Admit: 2023-02-16 | Discharge: 2023-02-16 | Disposition: A | Discharge status: Home / Self Care | Payer: BC Managed Care – PPO | Source: Ambulatory Visit | Attending: Surgery | Admitting: Surgery

## 2023-02-16 ENCOUNTER — Encounter (HOSPITAL_COMMUNITY): Payer: Self-pay

## 2023-02-16 VITALS — BP 155/103 | HR 73 | Temp 98.9°F | Resp 16 | Ht 72.0 in | Wt >= 6400 oz

## 2023-02-16 DIAGNOSIS — Z01818 Encounter for other preprocedural examination: Secondary | ICD-10-CM | POA: Diagnosis present

## 2023-02-16 HISTORY — DX: Family history of other specified conditions: Z84.89

## 2023-02-16 HISTORY — DX: Sleep apnea, unspecified: G47.30

## 2023-02-16 LAB — COMPREHENSIVE METABOLIC PANEL
ALT: 50 U/L — ABNORMAL HIGH (ref 0–44)
AST: 30 U/L (ref 15–41)
Albumin: 4.1 g/dL (ref 3.5–5.0)
Alkaline Phosphatase: 53 U/L (ref 38–126)
Anion gap: 4 — ABNORMAL LOW (ref 5–15)
BUN: 15 mg/dL (ref 6–20)
CO2: 27 mmol/L (ref 22–32)
Calcium: 8.7 mg/dL — ABNORMAL LOW (ref 8.9–10.3)
Chloride: 105 mmol/L (ref 98–111)
Creatinine, Ser: 0.96 mg/dL (ref 0.61–1.24)
GFR, Estimated: >60 mL/min (ref >60)
Glucose, Bld: 120 mg/dL — ABNORMAL HIGH (ref 70–99)
Potassium: 4.2 mmol/L (ref 3.5–5.1)
Sodium: 136 mmol/L (ref 135–145)
Total Bilirubin: 0.8 mg/dL (ref 0.3–1.2)
Total Protein: 7.2 g/dL (ref 6.5–8.1)

## 2023-02-16 LAB — CBC
HCT: 41.2 % (ref 39.0–52.0)
Hemoglobin: 13.8 g/dL (ref 13.0–17.0)
MCH: 28.5 pg (ref 26.0–34.0)
MCHC: 33.5 g/dL (ref 30.0–36.0)
MCV: 85.1 fL (ref 80.0–100.0)
Platelets: 228 10*3/uL (ref 150–400)
RBC: 4.84 MIL/uL (ref 4.22–5.81)
RDW: 14 % (ref 11.5–15.5)
WBC: 8.8 10*3/uL (ref 4.0–10.5)
nRBC: 0 % (ref 0.0–0.2)

## 2023-03-14 ENCOUNTER — Ambulatory Visit: Payer: BC Managed Care – PPO

## 2023-04-10 ENCOUNTER — Telehealth: Payer: Self-pay | Admitting: Skilled Nursing Facility1

## 2023-04-10 NOTE — Telephone Encounter (Signed)
Dietitian called pt to assess their understanding of the pre-op nutrition recommendations through the teach back method to ensure the pts knowledge readiness in preparation for surgery.    Dietitian engaged in a comprehensive review of the pre-op and post op nutrition education as well as constipation protocol.  Sent PDF pre-op nutrition handout via email and went through the material with pt within the context of their current needs and comorbidities focusing on key points. With this conversation and the inclusion of pts family/friend pt was able to comprehend their current diet phase and 2 weeks after surgery as represented by the teach back method.

## 2023-05-02 ENCOUNTER — Ambulatory Visit: Payer: Self-pay | Admitting: Surgery

## 2023-05-02 DIAGNOSIS — Z01818 Encounter for other preprocedural examination: Secondary | ICD-10-CM

## 2023-05-02 NOTE — Progress Notes (Signed)
Surgery orders requested via Epic inbox. °

## 2023-05-09 ENCOUNTER — Encounter (HOSPITAL_COMMUNITY)
Admission: RE | Admit: 2023-05-09 | Discharge: 2023-05-09 | Disposition: A | Discharge status: Home / Self Care | Payer: BC Managed Care – PPO | Source: Ambulatory Visit | Attending: Surgery | Admitting: Surgery

## 2023-05-09 ENCOUNTER — Encounter (HOSPITAL_COMMUNITY): Payer: Self-pay

## 2023-05-09 ENCOUNTER — Other Ambulatory Visit: Payer: Self-pay

## 2023-05-09 DIAGNOSIS — Z01812 Encounter for preprocedural laboratory examination: Secondary | ICD-10-CM | POA: Diagnosis not present

## 2023-05-09 DIAGNOSIS — Z01818 Encounter for other preprocedural examination: Secondary | ICD-10-CM

## 2023-05-09 LAB — CBC WITH DIFFERENTIAL/PLATELET
Abs Immature Granulocytes: 0.07 10*3/uL (ref 0.00–0.07)
Basophils Absolute: 0.1 10*3/uL (ref 0.0–0.1)
Basophils Relative: 1 %
Eosinophils Absolute: 0.3 10*3/uL (ref 0.0–0.5)
Eosinophils Relative: 5 %
HCT: 40.7 % (ref 39.0–52.0)
Hemoglobin: 13.3 g/dL (ref 13.0–17.0)
Immature Granulocytes: 1 %
Lymphocytes Relative: 36 %
Lymphs Abs: 2.6 10*3/uL (ref 0.7–4.0)
MCH: 28.1 pg (ref 26.0–34.0)
MCHC: 32.7 g/dL (ref 30.0–36.0)
MCV: 85.9 fL (ref 80.0–100.0)
Monocytes Absolute: 0.6 10*3/uL (ref 0.1–1.0)
Monocytes Relative: 8 %
Neutro Abs: 3.7 10*3/uL (ref 1.7–7.7)
Neutrophils Relative %: 49 %
Platelets: 251 10*3/uL (ref 150–400)
RBC: 4.74 MIL/uL (ref 4.22–5.81)
RDW: 13.8 % (ref 11.5–15.5)
WBC: 7.4 10*3/uL (ref 4.0–10.5)
nRBC: 0 % (ref 0.0–0.2)

## 2023-05-09 LAB — COMPREHENSIVE METABOLIC PANEL
ALT: 48 U/L — ABNORMAL HIGH (ref 0–44)
AST: 28 U/L (ref 15–41)
Albumin: 4 g/dL (ref 3.5–5.0)
Alkaline Phosphatase: 50 U/L (ref 38–126)
Anion gap: 8 (ref 5–15)
BUN: 13 mg/dL (ref 6–20)
CO2: 24 mmol/L (ref 22–32)
Calcium: 8.5 mg/dL — ABNORMAL LOW (ref 8.9–10.3)
Chloride: 103 mmol/L (ref 98–111)
Creatinine, Ser: 0.83 mg/dL (ref 0.61–1.24)
GFR, Estimated: >60 mL/min (ref >60)
Glucose, Bld: 117 mg/dL — ABNORMAL HIGH (ref 70–99)
Potassium: 4.2 mmol/L (ref 3.5–5.1)
Sodium: 135 mmol/L (ref 135–145)
Total Bilirubin: 0.4 mg/dL (ref 0.3–1.2)
Total Protein: 7 g/dL (ref 6.5–8.1)

## 2023-05-09 LAB — TYPE AND SCREEN
ABO/RH(D): O POS
Antibody Screen: NEGATIVE

## 2023-05-09 NOTE — Progress Notes (Addendum)
COVID Vaccine received:  []  No [x]  Yes Date of any COVID positive Test in last 90 days: Yes PCP - [facility] Cardiologist - No  Chest x-ray - 11/17/22  EPIC EKG -  11/17/22  EPIC Stress Test -  ECHO -  Cardiac Cath -   Bowel Prep - [x]  No  []   Yes ______  Pacemaker / ICD device [x]  No []  Yes   Spinal Cord Stimulator:[x]  No []  Yes       History of Sleep Apnea? []  No [x]  Yes   CPAP used?- []  No [x]  Yes    Does the patient monitor blood sugar?          [x]  No []  Yes  []  N/A  Patient has: [x]  NO Hx DM   []  Pre-DM                 []  DM1  []   DM2 Does patient have a [employer] or Dexacom? [x]  No []  Yes   Fasting Blood Sugar Ranges-  Checks Blood Sugar ____0_ times a day  GLP1 agonist / usual dose - No GLP1 instructions:  SGLT-2 inhibitors / usual dose - No SGLT-2 instructions:   Blood Thinner / Instructions:No Aspirin Instructions:No  Comments:   Activity level: Patient is able  to climb a flight of stairs without difficulty; [x]  No CP  []  No SOB    Patient can  perform ADLs without assistance.   Anesthesia review:   Patient denies shortness of breath, fever, cough and chest pain at PAT appointment.  Patient verbalized understanding and agreement to the Pre-Surgical Instructions that were given to them at this PAT appointment. Patient was also educated of the need to review these PAT instructions again prior to his/her surgery.I reviewed the appropriate phone numbers to call if they have any and questions or concerns.

## 2023-05-09 NOTE — Patient Instructions (Addendum)
SURGICAL WAITING ROOM VISITATION  Patients having surgery or a procedure may have no more than 2 support people in the waiting area - these visitors may rotate.    Children under the age of 26 must have an adult with them who is not the patient.  Due to an increase in RSV and influenza rates and associated hospitalizations, children ages 23 and under may not visit patients in [facility] hospitals.  If the patient needs to stay at the hospital during part of their recovery, the visitor guidelines for inpatient rooms apply. Pre-op nurse will coordinate an appropriate time for 1 support person to accompany patient in pre-op.  This support person may not rotate.    Please refer to the [facility] website for the visitor guidelines for Inpatients (after your surgery is over and you are in a regular room).       Your procedure is scheduled on: 05/16/23   Report to [facility] Main Entrance    Report to admitting at 5:15 AM   Call this number if you have problems the morning of surgery [phone]   Do not eat food :After 6PM   After Midnight you may have the following liquids until4:30 AM DAY OF SURGERY  Water Non-Citrus Juices (without pulp, NO RED-Apple, White grape, White cranberry) Black Coffee (NO MILK/CREAM OR CREAMERS, sugar ok)  Clear Tea (NO MILK/CREAM OR CREAMERS, sugar ok) regular and decaf                             Plain Jell-O (NO RED)                                           Fruit ices (not with fruit pulp, NO RED)                                     Popsicles (NO RED)                                                               Sports drinks like Gatorade (NO RED)                  The day of surgery:  Drink ONE (1) Pre-Surgery  G2 at 4:30 AM the morning of surgery. Drink in one sitting. Do not sip.  This drink was given to you during your hospital  pre-op appointment visit. Nothing else to drink after completing the  Pre-Surgery G2.    Oral Hygiene  is also important to reduce your risk of infection.                                    Remember - BRUSH YOUR TEETH THE MORNING OF SURGERY WITH YOUR REGULAR TOOTHPASTE   Take these medicines the morning of surgery with A SIP OF WATER: Febofibrate, , Metoprolol, Lexapro  Bring CPAP mask and tubing day of surgery.  You may not have any metal on your body including hair pins, jewelry, and body piercing             Do not wear make-up, lotions, powders, perfumes/cologne, or deodorant  Do not shave  48 hours prior to surgery.               Men may shave face and neck.   Do not bring valuables to the hospital. [facility] IS NOT             RESPONSIBLE   FOR VALUABLES.   Contacts, glasses, dentures or bridgework may not be worn into surgery.   Bring small overnight bag day of surgery.   DO NOT BRING YOUR HOME MEDICATIONS TO THE HOSPITAL. PHARMACY WILL DISPENSE MEDICATIONS LISTED ON YOUR MEDICATION LIST TO YOU DURING YOUR ADMISSION IN THE HOSPITAL!    Patients discharged on the day of surgery will not be allowed to drive home.  Someone NEEDS to stay with you for the first 24 hours after anesthesia.   Special Instructions: Bring a copy of your healthcare power of attorney and living will documents the day of surgery if you haven't scanned them before.              Please read over the following fact sheets you were given: IF YOU HAVE QUESTIONS ABOUT YOUR PRE-OP INSTRUCTIONS PLEASE CALL [phone]   If you received a COVID test during your pre-op visit  it is requested that you wear a mask when out in public, stay away from anyone that may not be feeling well and notify your surgeon if you develop symptoms. If you test positive for Covid or have been in contact with anyone that has tested positive in the last 10 days please notify you surgeon.    [place] - Preparing for Surgery Before surgery, you can play an important role.  Because skin is not sterile,  your skin needs to be as free of germs as possible.  You can reduce the number of germs on your skin by washing with CHG (chlorahexidine gluconate) soap before surgery.  CHG is an antiseptic cleaner which kills germs and bonds with the skin to continue killing germs even after washing. Please DO NOT use if you have an allergy to CHG or antibacterial soaps.  If your skin becomes reddened/irritated stop using the CHG and inform your nurse when you arrive at Short Stay. Do not shave (including legs and underarms) for at least 48 hours prior to the first CHG shower.  You may shave your face/neck.  Please follow these instructions carefully:  1.  Shower with CHG Soap the night before surgery and the  morning of surgery.  2.  If you choose to wash your hair, wash your hair first as usual with your normal  shampoo.  3.  After you shampoo, rinse your hair and body thoroughly to remove the shampoo.                             4.  Use CHG as you would any other liquid soap.  You can apply chg directly to the skin and wash.  Gently with a scrungie or clean washcloth.  5.  Apply the CHG Soap to your body ONLY FROM THE NECK DOWN.   Do   not use on face/ open  Wound or open sores. Avoid contact with eyes, ears mouth and   genitals (private parts).                       Wash face,  Genitals (private parts) with your normal soap.             6.  Wash thoroughly, paying special attention to the area where your    surgery  will be performed.  7.  Thoroughly rinse your body with warm water from the neck down.  8.  DO NOT shower/wash with your normal soap after using and rinsing off the CHG Soap.                9.  Pat yourself dry with a clean towel.            10.  Wear clean pajamas.            11.  Place clean sheets on your bed the night of your first shower and do not  sleep with pets. Day of Surgery : Do not apply any lotions/deodorants the morning of surgery.  Please wear clean  clothes to the hospital/surgery center.  FAILURE TO FOLLOW THESE INSTRUCTIONS MAY RESULT IN THE CANCELLATION OF YOUR SURGERY  PATIENT SIGNATURE_________________________________  NURSE SIGNATURE__________________________________  ________________________________________________________________________MORNING OF SURGERY DRINK:   DRINK 1 G2 drink BEFORE YOU LEAVE HOME, DRINK ALL OF THE  G2 DRINK AT ONE TIME.   NO SOLID FOOD AFTER 600 PM THE NIGHT BEFORE YOUR SURGERY. YOU MAY DRINK CLEAR FLUIDS. THE G2 DRINK YOU DRINK BEFORE YOU LEAVE HOME WILL BE THE LAST FLUIDS YOU DRINK BEFORE SURGERY.  PAIN IS EXPECTED AFTER SURGERY AND WILL NOT BE COMPLETELY ELIMINATED. AMBULATION AND TYLENOL WILL HELP REDUCE INCISIONAL AND GAS PAIN. MOVEMENT IS KEY!  YOU ARE EXPECTED TO BE OUT OF BED WITHIN 4 HOURS OF ADMISSION TO YOUR PATIENT ROOM.  SITTING IN THE RECLINER THROUGHOUT THE DAY IS IMPORTANT FOR DRINKING FLUIDS AND MOVING GAS THROUGHOUT THE GI TRACT.  COMPRESSION STOCKINGS SHOULD BE WORN [facility] STAY UNLESS YOU ARE WALKING.   INCENTIVE SPIROMETER SHOULD BE USED EVERY HOUR WHILE AWAKE TO DECREASE POST-OPERATIVE COMPLICATIONS SUCH AS PNEUMONIA.  WHEN DISCHARGED HOME, IT IS IMPORTANT TO CONTINUE TO WALK EVERY HOUR AND USE THE INCENTIVE SPIROMETER EVERY HOUR.

## 2023-05-15 NOTE — Anesthesia Preprocedure Evaluation (Addendum)
Anesthesia Evaluation  Patient identified by MRN, date of birth, ID band Patient awake    Reviewed: Allergy & Precautions, NPO status , Patient's Chart, lab work & pertinent test results, reviewed documented beta blocker date and time   History of Anesthesia Complications Negative for: history of anesthetic complications  Airway Mallampati: III  TM Distance: >3 FB Neck ROM: Full    Dental  (+) Missing   Pulmonary sleep apnea and Continuous Positive Airway Pressure Ventilation    Pulmonary exam normal        Cardiovascular hypertension, Pt. on medications and Pt. on home beta blockers Normal cardiovascular exam     Neuro/Psych    GI/Hepatic negative GI ROS, Neg liver ROS,,,  Endo/Other    Morbid obesity  Renal/GU negative Renal ROS     Musculoskeletal negative musculoskeletal ROS (+)    Abdominal   Peds  Hematology negative hematology ROS (+)   Anesthesia Other Findings Day of surgery medications reviewed with patient.  Reproductive/Obstetrics negative OB ROS                              Anesthesia Physical Anesthesia Plan  ASA: 3  Anesthesia Plan: General   Post-op Pain Management: Ketamine IV*, Lidocaine infusion* and Tylenol PO (pre-op)*   Induction: Intravenous  PONV Risk Score and Plan: 2 and Treatment may vary due to age or medical condition, Ondansetron, Dexamethasone, Midazolam, Propofol infusion, Aprepitant and Scopolamine patch - Pre-op  Airway Management Planned: Oral ETT  Additional Equipment: None  Intra-op Plan:   Post-operative Plan: Extubation in OR  Informed Consent: I have reviewed the patients History and Physical, chart, labs and discussed the procedure including the risks, benefits and alternatives for the proposed anesthesia with the patient or authorized representative who has indicated his/her understanding and acceptance.     Dental advisory  given  Plan Discussed with: CRNA  Anesthesia Plan Comments:         Anesthesia Quick Evaluation

## 2023-05-16 ENCOUNTER — Other Ambulatory Visit: Payer: Self-pay

## 2023-05-16 ENCOUNTER — Inpatient Hospital Stay (HOSPITAL_COMMUNITY)
Admission: RE | Admit: 2023-05-16 | Discharge: 2023-05-17 | DRG: 621 | Disposition: A | Discharge status: Home / Self Care | Payer: BC Managed Care – PPO | Attending: Surgery | Admitting: Surgery

## 2023-05-16 ENCOUNTER — Inpatient Hospital Stay (HOSPITAL_COMMUNITY): Payer: BC Managed Care – PPO | Admitting: Certified Registered Nurse Anesthetist

## 2023-05-16 ENCOUNTER — Inpatient Hospital Stay (HOSPITAL_COMMUNITY): Payer: BC Managed Care – PPO | Admitting: Physician Assistant

## 2023-05-16 ENCOUNTER — Encounter (HOSPITAL_COMMUNITY): Payer: Self-pay | Admitting: Surgery

## 2023-05-16 ENCOUNTER — Other Ambulatory Visit (HOSPITAL_COMMUNITY): Payer: Self-pay

## 2023-05-16 ENCOUNTER — Encounter (HOSPITAL_COMMUNITY)
Admission: RE | Disposition: A | Discharge status: Home / Self Care | Payer: Self-pay | Source: Home / Self Care | Attending: Surgery

## 2023-05-16 DIAGNOSIS — Z79899 Other long term (current) drug therapy: Secondary | ICD-10-CM | POA: Diagnosis not present

## 2023-05-16 DIAGNOSIS — I10 Essential (primary) hypertension: Secondary | ICD-10-CM | POA: Diagnosis present

## 2023-05-16 DIAGNOSIS — Z6841 Body Mass Index (BMI) 40.0 and over, adult: Secondary | ICD-10-CM | POA: Diagnosis not present

## 2023-05-16 DIAGNOSIS — Z01818 Encounter for other preprocedural examination: Secondary | ICD-10-CM

## 2023-05-16 DIAGNOSIS — G4733 Obstructive sleep apnea (adult) (pediatric): Secondary | ICD-10-CM | POA: Diagnosis present

## 2023-05-16 LAB — HEMOGLOBIN AND HEMATOCRIT, BLOOD
HCT: 44.6 % (ref 39.0–52.0)
Hemoglobin: 14.6 g/dL (ref 13.0–17.0)

## 2023-05-16 LAB — CBC
HCT: 42 % (ref 39.0–52.0)
Hemoglobin: 13.7 g/dL (ref 13.0–17.0)
MCH: 28.3 pg (ref 26.0–34.0)
MCHC: 32.6 g/dL (ref 30.0–36.0)
MCV: 86.8 fL (ref 80.0–100.0)
Platelets: 269 10*3/uL (ref 150–400)
RBC: 4.84 MIL/uL (ref 4.22–5.81)
RDW: 14.3 % (ref 11.5–15.5)
WBC: 13.3 10*3/uL — ABNORMAL HIGH (ref 4.0–10.5)
nRBC: 0 % (ref 0.0–0.2)

## 2023-05-16 LAB — CREATININE, SERUM
Creatinine, Ser: 1.08 mg/dL (ref 0.61–1.24)
GFR, Estimated: >60 mL/min (ref >60)

## 2023-05-16 LAB — TYPE AND SCREEN

## 2023-05-16 SURGERY — REMOVAL, GASTRIC BAND, ROBOT-ASSISTED
Anesthesia: General

## 2023-05-16 MED ORDER — DEXAMETHASONE SODIUM PHOSPHATE 10 MG/ML IJ SOLN
INTRAMUSCULAR | Status: AC
  Filled 2023-05-16: qty 1

## 2023-05-16 MED ORDER — EPHEDRINE SULFATE-NACL 50-0.9 MG/10ML-% IV SOSY
PREFILLED_SYRINGE | INTRAVENOUS | Status: DC | PRN
  Administered 2023-05-16: 5 mg via INTRAVENOUS

## 2023-05-16 MED ORDER — ACETAMINOPHEN 500 MG PO TABS
1000.0000 mg | ORAL_TABLET | ORAL | Status: AC
  Administered 2023-05-16: 1000 mg via ORAL
  Filled 2023-05-16: qty 2

## 2023-05-16 MED ORDER — BUPIVACAINE LIPOSOME 1.3 % IJ SUSP
INTRAMUSCULAR | Status: DC | PRN
  Administered 2023-05-16: 50 mL

## 2023-05-16 MED ORDER — 0.9 % SODIUM CHLORIDE (POUR BTL) OPTIME
TOPICAL | Status: DC | PRN
  Administered 2023-05-16: 1000 mL

## 2023-05-16 MED ORDER — PROPOFOL 500 MG/50ML IV EMUL
INTRAVENOUS | Status: DC | PRN
  Administered 2023-05-16: 50 ug/kg/min via INTRAVENOUS

## 2023-05-16 MED ORDER — BUPIVACAINE HCL (PF) 0.25 % IJ SOLN
INTRAMUSCULAR | Status: AC
  Filled 2023-05-16: qty 30

## 2023-05-16 MED ORDER — LIDOCAINE HCL (PF) 2 % IJ SOLN
INTRAMUSCULAR | Status: AC
  Filled 2023-05-16: qty 5

## 2023-05-16 MED ORDER — PROPOFOL 1000 MG/100ML IV EMUL
INTRAVENOUS | Status: AC
  Filled 2023-05-16: qty 100

## 2023-05-16 MED ORDER — KETAMINE HCL 50 MG/5ML IJ SOSY
PREFILLED_SYRINGE | INTRAMUSCULAR | Status: AC
  Filled 2023-05-16: qty 5

## 2023-05-16 MED ORDER — PHENYLEPHRINE 80 MCG/ML (10ML) SYRINGE FOR IV PUSH (FOR BLOOD PRESSURE SUPPORT)
PREFILLED_SYRINGE | INTRAVENOUS | Status: AC
  Filled 2023-05-16: qty 10

## 2023-05-16 MED ORDER — DEXAMETHASONE SODIUM PHOSPHATE 10 MG/ML IJ SOLN
INTRAMUSCULAR | Status: DC | PRN
  Administered 2023-05-16: 10 mg via INTRAVENOUS

## 2023-05-16 MED ORDER — ORAL CARE MOUTH RINSE: 15.0000 mL | Freq: Once | OROMUCOSAL | Status: DC

## 2023-05-16 MED ORDER — SIMETHICONE 80 MG PO CHEW
80.0000 mg | CHEWABLE_TABLET | Freq: Four times a day (QID) | ORAL | Status: DC | PRN
  Administered 2023-05-16 – 2023-05-17 (×2): 80 mg via ORAL
  Filled 2023-05-16 (×2): qty 1

## 2023-05-16 MED ORDER — ACETAMINOPHEN 500 MG PO TABS
1000.0000 mg | ORAL_TABLET | Freq: Three times a day (TID) | ORAL | Status: DC
  Administered 2023-05-16 – 2023-05-17 (×3): 1000 mg via ORAL
  Filled 2023-05-16 (×3): qty 2

## 2023-05-16 MED ORDER — OXYCODONE HCL 5 MG/5ML PO SOLN
5.0000 mg | Freq: Four times a day (QID) | ORAL | Status: DC | PRN
  Administered 2023-05-16 – 2023-05-17 (×4): 5 mg via ORAL
  Filled 2023-05-16 (×4): qty 5

## 2023-05-16 MED ORDER — LACTATED RINGERS IV SOLN: INTRAVENOUS | Status: DC

## 2023-05-16 MED ORDER — MIDAZOLAM HCL 5 MG/5ML IJ SOLN
INTRAMUSCULAR | Status: DC | PRN
  Administered 2023-05-16: 2 mg via INTRAVENOUS

## 2023-05-16 MED ORDER — ESCITALOPRAM OXALATE 20 MG PO TABS
20.0000 mg | ORAL_TABLET | Freq: Every day | ORAL | Status: DC
  Administered 2023-05-17: 20 mg via ORAL
  Filled 2023-05-16: qty 1

## 2023-05-16 MED ORDER — CHLORHEXIDINE GLUCONATE CLOTH 2 % EX PADS: 6.0000 | MEDICATED_PAD | Freq: Once | CUTANEOUS | Status: DC

## 2023-05-16 MED ORDER — METOPROLOL SUCCINATE ER 100 MG PO TB24
100.0000 mg | ORAL_TABLET | Freq: Every day | ORAL | Status: DC
  Administered 2023-05-17: 100 mg via ORAL
  Filled 2023-05-16: qty 1

## 2023-05-16 MED ORDER — ACETAMINOPHEN 10 MG/ML IV SOLN
1000.0000 mg | Freq: Once | INTRAVENOUS | Status: DC | PRN
  Administered 2023-05-16: 1000 mg via INTRAVENOUS

## 2023-05-16 MED ORDER — HYDROMORPHONE HCL 1 MG/ML IJ SOLN: 0.2500 mg | INTRAMUSCULAR | Status: DC | PRN

## 2023-05-16 MED ORDER — APREPITANT 40 MG PO CAPS
40.0000 mg | ORAL_CAPSULE | ORAL | Status: AC
  Administered 2023-05-16: 40 mg via ORAL
  Filled 2023-05-16: qty 1

## 2023-05-16 MED ORDER — PHENYLEPHRINE 80 MCG/ML (10ML) SYRINGE FOR IV PUSH (FOR BLOOD PRESSURE SUPPORT)
PREFILLED_SYRINGE | INTRAVENOUS | Status: DC | PRN
  Administered 2023-05-16: 160 ug via INTRAVENOUS
  Administered 2023-05-16: 80 ug via INTRAVENOUS

## 2023-05-16 MED ORDER — HEPARIN SODIUM (PORCINE) 5000 UNIT/ML IJ SOLN
5000.0000 [IU] | INTRAMUSCULAR | Status: AC
  Administered 2023-05-16: 5000 [IU] via SUBCUTANEOUS
  Filled 2023-05-16: qty 1

## 2023-05-16 MED ORDER — FENTANYL CITRATE (PF) 250 MCG/5ML IJ SOLN
INTRAMUSCULAR | Status: DC | PRN
  Administered 2023-05-16 (×2): 50 ug via INTRAVENOUS
  Administered 2023-05-16: 100 ug via INTRAVENOUS
  Administered 2023-05-16: 50 ug via INTRAVENOUS

## 2023-05-16 MED ORDER — EPHEDRINE 5 MG/ML INJ
INTRAVENOUS | Status: AC
  Filled 2023-05-16: qty 5

## 2023-05-16 MED ORDER — PROPOFOL 10 MG/ML IV BOLUS
INTRAVENOUS | Status: AC
  Filled 2023-05-16: qty 20

## 2023-05-16 MED ORDER — GABAPENTIN 300 MG PO CAPS
300.0000 mg | ORAL_CAPSULE | ORAL | Status: AC
  Administered 2023-05-16: 300 mg via ORAL
  Filled 2023-05-16: qty 1

## 2023-05-16 MED ORDER — ACETAMINOPHEN 10 MG/ML IV SOLN
INTRAVENOUS | Status: AC
  Filled 2023-05-16: qty 100

## 2023-05-16 MED ORDER — PANTOPRAZOLE SODIUM 40 MG IV SOLR
40.0000 mg | Freq: Every day | INTRAVENOUS | Status: DC
  Administered 2023-05-16: 40 mg via INTRAVENOUS
  Filled 2023-05-16: qty 10

## 2023-05-16 MED ORDER — HEPARIN SODIUM (PORCINE) 5000 UNIT/ML IJ SOLN
5000.0000 [IU] | Freq: Three times a day (TID) | INTRAMUSCULAR | Status: DC
  Administered 2023-05-16 – 2023-05-17 (×3): 5000 [IU] via SUBCUTANEOUS
  Filled 2023-05-16 (×3): qty 1

## 2023-05-16 MED ORDER — ROCURONIUM BROMIDE 10 MG/ML (PF) SYRINGE
PREFILLED_SYRINGE | INTRAVENOUS | Status: DC | PRN
  Administered 2023-05-16: 20 mg via INTRAVENOUS
  Administered 2023-05-16: 30 mg via INTRAVENOUS
  Administered 2023-05-16: 10 mg via INTRAVENOUS
  Administered 2023-05-16: 30 mg via INTRAVENOUS
  Administered 2023-05-16: 90 mg via INTRAVENOUS

## 2023-05-16 MED ORDER — BUPIVACAINE LIPOSOME 1.3 % IJ SUSP
INTRAMUSCULAR | Status: AC
  Filled 2023-05-16: qty 20

## 2023-05-16 MED ORDER — APREPITANT 40 MG PO CAPS
40.0000 mg | ORAL_CAPSULE | Freq: Once | ORAL | Status: DC
Start: 2023-05-16 — End: 2023-05-16

## 2023-05-16 MED ORDER — SUCCINYLCHOLINE CHLORIDE 200 MG/10ML IV SOSY
PREFILLED_SYRINGE | INTRAVENOUS | Status: DC | PRN
  Administered 2023-05-16: 200 mg via INTRAVENOUS

## 2023-05-16 MED ORDER — ONDANSETRON HCL 4 MG/2ML IJ SOLN
INTRAMUSCULAR | Status: AC
  Filled 2023-05-16: qty 2

## 2023-05-16 MED ORDER — AMISULPRIDE (ANTIEMETIC) 5 MG/2ML IV SOLN
INTRAVENOUS | Status: AC
  Filled 2023-05-16: qty 4

## 2023-05-16 MED ORDER — FENTANYL CITRATE (PF) 250 MCG/5ML IJ SOLN
INTRAMUSCULAR | Status: AC
  Filled 2023-05-16: qty 5

## 2023-05-16 MED ORDER — FENOFIBRATE 134 MG PO CAPS: 134.0000 mg | ORAL_CAPSULE | Freq: Every day | ORAL | Status: DC

## 2023-05-16 MED ORDER — ENSURE MAX PROTEIN PO LIQD
2.0000 [oz_av] | ORAL | Status: DC
  Administered 2023-05-17 (×3): 2 [oz_av] via ORAL

## 2023-05-16 MED ORDER — MIDAZOLAM HCL 2 MG/2ML IJ SOLN
INTRAMUSCULAR | Status: AC
  Filled 2023-05-16: qty 2

## 2023-05-16 MED ORDER — MORPHINE SULFATE (PF) 2 MG/ML IV SOLN: 1.0000 mg | INTRAVENOUS | Status: DC | PRN

## 2023-05-16 MED ORDER — CHLORHEXIDINE GLUCONATE 0.12 % MT SOLN: 15.0000 mL | Freq: Once | OROMUCOSAL | Status: DC

## 2023-05-16 MED ORDER — ONDANSETRON HCL 4 MG/2ML IJ SOLN
4.0000 mg | INTRAMUSCULAR | Status: DC | PRN
  Administered 2023-05-16: 4 mg via INTRAVENOUS
  Filled 2023-05-16: qty 2

## 2023-05-16 MED ORDER — SUGAMMADEX SODIUM 200 MG/2ML IV SOLN
INTRAVENOUS | Status: DC | PRN
  Administered 2023-05-16: 400 mg via INTRAVENOUS

## 2023-05-16 MED ORDER — BUPIVACAINE LIPOSOME 1.3 % IJ SUSP: 20.0000 mL | Freq: Once | INTRAMUSCULAR | Status: DC

## 2023-05-16 MED ORDER — KETAMINE HCL 10 MG/ML IJ SOLN
INTRAMUSCULAR | Status: DC | PRN
  Administered 2023-05-16: 20 mg via INTRAVENOUS
  Administered 2023-05-16: 30 mg via INTRAVENOUS

## 2023-05-16 MED ORDER — SCOPOLAMINE 1 MG/3DAYS TD PT72
1.0000 | MEDICATED_PATCH | TRANSDERMAL | Status: DC
  Administered 2023-05-16: 1.5 mg via TRANSDERMAL
  Filled 2023-05-16: qty 1

## 2023-05-16 MED ORDER — LIDOCAINE 2% (20 MG/ML) 5 ML SYRINGE
INTRAMUSCULAR | Status: DC | PRN
  Administered 2023-05-16: 100 mg via INTRAVENOUS
  Administered 2023-05-16: 1.5 mg/kg/h via INTRAVENOUS

## 2023-05-16 MED ORDER — ROCURONIUM BROMIDE 10 MG/ML (PF) SYRINGE
PREFILLED_SYRINGE | INTRAVENOUS | Status: AC
  Filled 2023-05-16: qty 10

## 2023-05-16 MED ORDER — LIDOCAINE HCL 2 % IJ SOLN
INTRAMUSCULAR | Status: AC
  Filled 2023-05-16: qty 20

## 2023-05-16 MED ORDER — SODIUM CHLORIDE 0.9 % IV SOLN
2.0000 g | INTRAVENOUS | Status: AC
  Administered 2023-05-16: 2 g via INTRAVENOUS
  Filled 2023-05-16: qty 2

## 2023-05-16 MED ORDER — PROPOFOL 10 MG/ML IV BOLUS
INTRAVENOUS | Status: DC | PRN
  Administered 2023-05-16: 200 mg via INTRAVENOUS

## 2023-05-16 MED ORDER — METOPROLOL TARTRATE 5 MG/5ML IV SOLN: 5.0000 mg | Freq: Four times a day (QID) | INTRAVENOUS | Status: DC | PRN

## 2023-05-16 MED ORDER — ONDANSETRON HCL 4 MG/2ML IJ SOLN
INTRAMUSCULAR | Status: DC | PRN
  Administered 2023-05-16: 4 mg via INTRAVENOUS

## 2023-05-16 MED ORDER — AMISULPRIDE (ANTIEMETIC) 5 MG/2ML IV SOLN
10.0000 mg | Freq: Once | INTRAVENOUS | Status: AC | PRN
  Administered 2023-05-16: 10 mg via INTRAVENOUS

## 2023-05-16 MED ORDER — HYDRALAZINE HCL 20 MG/ML IJ SOLN: 10.0000 mg | INTRAMUSCULAR | Status: DC | PRN

## 2023-05-16 MED ORDER — ACETAMINOPHEN 160 MG/5ML PO SOLN: 1000.0000 mg | Freq: Three times a day (TID) | ORAL | Status: DC

## 2023-05-16 MED ORDER — LISINOPRIL 10 MG PO TABS
10.0000 mg | ORAL_TABLET | Freq: Every day | ORAL | Status: DC
  Administered 2023-05-16 – 2023-05-17 (×2): 10 mg via ORAL
  Filled 2023-05-16 (×2): qty 1

## 2023-05-16 SURGICAL SUPPLY — 75 items
ADH SKN CLS APL DERMABOND .7 (GAUZE/BANDAGES/DRESSINGS) ×1
ANTIFOG SOL W/FOAM PAD STRL (MISCELLANEOUS) ×1
APL PRP STRL LF DISP 70% ISPRP (MISCELLANEOUS) ×2
APPLIER CLIP 5 13 M/L LIGAMAX5 (MISCELLANEOUS)
APPLIER CLIP ROT 10 11.4 M/L (STAPLE)
APR CLP MED LRG 11.4X10 (STAPLE)
APR CLP MED LRG 5 ANG JAW (MISCELLANEOUS)
BLADE SURG SZ11 CARB STEEL (BLADE) ×1 IMPLANT
CANNULA REDUCER 12-8 DVNC XI (CANNULA) ×1 IMPLANT
CHLORAPREP W/TINT 26 (MISCELLANEOUS) ×2 IMPLANT
CLIP APPLIE 5 13 M/L LIGAMAX5 (MISCELLANEOUS) IMPLANT
CLIP APPLIE ROT 10 11.4 M/L (STAPLE) IMPLANT
COVER SURGICAL LIGHT HANDLE (MISCELLANEOUS) ×1 IMPLANT
COVER TIP SHEARS 8 DVNC (MISCELLANEOUS) ×1 IMPLANT
DERMABOND ADVANCED .7 DNX12 (GAUZE/BANDAGES/DRESSINGS) ×1 IMPLANT
DRAPE ARM DVNC X/XI (DISPOSABLE) ×4 IMPLANT
DRAPE COLUMN DVNC XI (DISPOSABLE) ×1 IMPLANT
DRIVER NDL LRG 8 DVNC XI (INSTRUMENTS) ×1 IMPLANT
DRIVER NDL MEGA SUTCUT DVNCXI (INSTRUMENTS) ×1 IMPLANT
DRIVER NDLE LRG 8 DVNC XI (INSTRUMENTS) ×1 IMPLANT
DRIVER NDLE MEGA SUTCUT DVNCXI (INSTRUMENTS) ×1 IMPLANT
ELECT REM PT RETURN 15FT ADLT (MISCELLANEOUS) ×1 IMPLANT
FORCEPS CADIERE DVNC XI (FORCEP) ×1 IMPLANT
GAUZE 4X4 16PLY ~~LOC~~+RFID DBL (SPONGE) ×1 IMPLANT
GLOVE BIO SURGEON STRL SZ7.5 (GLOVE) ×2 IMPLANT
GLOVE INDICATOR 8.0 STRL GRN (GLOVE) ×2 IMPLANT
GOWN STRL REUS W/ TWL XL LVL3 (GOWN DISPOSABLE) ×3 IMPLANT
GOWN STRL REUS W/TWL XL LVL3 (GOWN DISPOSABLE) ×3
GRASPER SUT TROCAR 14GX15 (MISCELLANEOUS) ×1 IMPLANT
GRASPER TIP-UP FEN DVNC XI (INSTRUMENTS) ×1 IMPLANT
IRRIG SUCT STRYKERFLOW 2 WTIP (MISCELLANEOUS)
IRRIGATION SUCT STRKRFLW 2 WTP (MISCELLANEOUS) IMPLANT
IRRIGATOR SUCT 8 DISP DVNC XI (IRRIGATION / IRRIGATOR) IMPLANT
KIT BASIN OR (CUSTOM PROCEDURE TRAY) ×1 IMPLANT
KIT GASTRIC LAVAGE 34FR ADT (SET/KITS/TRAYS/PACK) ×1 IMPLANT
KIT TURNOVER KIT A (KITS) IMPLANT
LUBRICANT JELLY K Y 4OZ (MISCELLANEOUS) IMPLANT
MARKER SKIN DUAL TIP RULER LAB (MISCELLANEOUS) ×1 IMPLANT
MAT PREVALON FULL STRYKER (MISCELLANEOUS) ×1 IMPLANT
NDL SPNL 18GX3.5 QUINCKE PK (NEEDLE) ×1 IMPLANT
NEEDLE SPNL 18GX3.5 QUINCKE PK (NEEDLE) ×1 IMPLANT
OBTURATOR OPTICAL STND 8 DVNC (TROCAR) ×1
OBTURATOR OPTICALSTD 8 DVNC (TROCAR) ×1 IMPLANT
PACK CARDIOVASCULAR III (CUSTOM PROCEDURE TRAY) ×1 IMPLANT
RELOAD STAPLE 60 2.5 WHT DVNC (STAPLE) ×3 IMPLANT
RELOAD STAPLE 60 3.5 BLU DVNC (STAPLE) IMPLANT
RELOAD STAPLER 2.5X60 WHT DVNC (STAPLE) ×4 IMPLANT
RELOAD STAPLER 3.5X60 BLU DVNC (STAPLE) ×4 IMPLANT
SCISSORS MNPLR CVD DVNC XI (INSTRUMENTS) ×1 IMPLANT
SEAL UNIV 5-12 XI (MISCELLANEOUS) ×4 IMPLANT
SEALER VESSEL EXT DVNC XI (MISCELLANEOUS) ×1 IMPLANT
SET TUBE SMOKE EVAC HIGH FLOW (TUBING) ×1 IMPLANT
SOL ELECTROSURG ANTI STICK (MISCELLANEOUS) ×1
SOLUTION ANTFG W/FOAM PAD STRL (MISCELLANEOUS) ×1 IMPLANT
SOLUTION ELECTROSURG ANTI STCK (MISCELLANEOUS) ×1 IMPLANT
SPIKE FLUID TRANSFER (MISCELLANEOUS) ×1 IMPLANT
STAPLER 60 SUREFORM DVNC (STAPLE) ×1 IMPLANT
STAPLER RELOAD 2.5X60 WHT DVNC (STAPLE) ×4
STAPLER RELOAD 3.5X60 BLU DVNC (STAPLE) ×4
SUT MNCRL AB 4-0 PS2 18 (SUTURE) ×1 IMPLANT
SUT SILK 0 SH 30 (SUTURE) IMPLANT
SUT SILK 2 0 SH (SUTURE) ×2 IMPLANT
SUT V-LOC BARB 180 2/0GR6 GS22 (SUTURE) ×2
SUT VIC AB 2-0 SH 27 (SUTURE)
SUT VIC AB 2-0 SH 27XBRD (SUTURE) IMPLANT
SUT VICRYL 0 TIES 12 18 (SUTURE) ×1 IMPLANT
SUT VLOC BARB 180 ABS3/0GR12 (SUTURE) ×2
SUTURE V-LC BRB 180 2/0GR6GS22 (SUTURE) ×2 IMPLANT
SUTURE VLOC BRB 180 ABS3/0GR12 (SUTURE) ×2 IMPLANT
SYR 20ML LL LF (SYRINGE) ×1 IMPLANT
TOWEL OR 17X26 10 PK STRL BLUE (TOWEL DISPOSABLE) ×1 IMPLANT
TRAY FOLEY MTR SLVR 16FR STAT (SET/KITS/TRAYS/PACK) IMPLANT
TROCAR ADV FIXATION 12X100MM (TROCAR) IMPLANT
TROCAR Z-THREAD FIOS 5X100MM (TROCAR) ×1 IMPLANT
TUBE CALIBRATION LAPBAND (TUBING) IMPLANT

## 2023-05-16 NOTE — Progress Notes (Addendum)
PHARMACY CONSULT FOR:  Risk Assessment for Post-Discharge VTE Following Bariatric Surgery  Post-Discharge VTE Risk Assessment: This patient's probability of 30-day post-discharge VTE is increased due to the factors marked:  Sleeve gastrectomy   Liver disorder (transplant, cirrhosis, or nonalcoholic steatohepatitis)   Hx of VTE   Hemorrhage requiring transfusion   GI perforation, leak, or obstruction   ====================================================  x  Male    Age >/=60 years   x BMI >/=50 kg/m2    CHF    Dyspnea at Rest    Paraplegia  x  Non-gastric-band surgery   x Operation Time >/=3 hr    Return to OR     Length of Stay >/= 3 d   Hypercoagulable condition   Significant venous stasis    Predicted probability of 30-day post-discharge VTE:0.8% moderate  Other patient-specific factors to consider:   Recommendation for Discharge: Enoxaparin 60 mg [place] q12h x 2 weeks post-discharge Copay $47.00  Manuel Wood is a 37 y.o. 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 BMI 58  Start 0802 End 1103   No Known Allergies  Patient Measurements: Height: 6' (182.9 cm) Weight: (!) 195 kg (429 lb 12.8 oz) IBW/kg (Calculated) : 77.6 Body mass index is 58.29 kg/m.  No results for input(s): "WBC", "HGB", "HCT", "PLT", "APTT", "CREATININE", "LABCREA", "CREAT24HRUR", "MG", "PHOS", "ALBUMIN", "PROT", "AST", "ALT", "ALKPHOS", "BILITOT", "BILIDIR", "IBILI" in the last 72 hours. Estimated Creatinine Clearance: 214.8 mL/min (by C-G formula based on SCr of 0.83 mg/dL).    Past Medical History:  Diagnosis Date   Family history of adverse reaction to anesthesia    father- nausea   Hypertension    Sleep apnea    cpap     Medications Prior to Admission  Medication Sig Dispense Refill Last Dose   escitalopram (LEXAPRO) 20 MG tablet Take 20 mg by mouth daily.   05/16/2023 at 0400   fenofibrate micronized (LOFIBRA) 134 MG capsule Take 134  mg by mouth daily.   05/16/2023 at 0400   lisinopril (ZESTRIL) 10 MG tablet Take 10 mg by mouth daily.   05/15/2023   metoprolol succinate (TOPROL-XL) 100 MG 24 hr tablet Take 100 mg by mouth daily.   05/16/2023 at 0400     Manuel Wood S. Merilynn Finland, PharmD, BCPS Clinical Staff Pharmacist [email or web address]  [name] 05/16/2023,12:24 PM

## 2023-05-16 NOTE — Transfer of Care (Signed)
Immediate Anesthesia Transfer of Care Note  Patient: Manuel Wood  Procedure(s) Performed: ROBOTIC LAP BAND REMOVAL XI ROBOT ASSISTED ROUX-EN-Y WITH UPPER ENDOSCOPY  Patient Location: PACU  Anesthesia Type:General  Level of Consciousness: awake, alert , and oriented  Airway & Oxygen Therapy: Patient Spontanous Breathing and Patient connected to face mask oxygen  Post-op Assessment: Report given to RN and Post -op Vital signs reviewed and stable  Post vital signs: Reviewed and stable  Last Vitals:  Vitals Value Taken Time  BP 156/87 05/16/23 1118  Temp 36.8 C 05/16/23 1118  Pulse 79 05/16/23 1121  Resp 20 05/16/23 1121  SpO2 95 % 05/16/23 1121  Vitals shown include unvalidated device data.  Last Pain:  Vitals:   05/16/23 0618  TempSrc:   PainSc: 0-No pain         Complications:  Encounter Notable Events  Notable Event Outcome Phase Comment  Difficult to intubate - expected  Intraprocedure Filed from anesthesia note documentation.

## 2023-05-16 NOTE — H&P (Signed)
Admitting Physician: Hyman Hopes Summar Mcglothlin  Service: Bariatric surgery  CC: Obesity, lap band in place  Subjective   HPI: Manuel Wood is an 37 y.o. male who is here for lap band removal and conversion to gastric bypass.  Past Medical History:  Diagnosis Date   Family history of adverse reaction to anesthesia    father- nausea   Hypertension    Sleep apnea    cpap    Past Surgical History:  Procedure Laterality Date   BIOPSY  01/10/2023   Procedure: BIOPSY;  Surgeon: Quentin Ore, MD;  Location: [facility] END[facility];  Service: General;;   CHOLECYSTECTOMY     ESOPHAGOGASTRODUODENOSCOPY N/A 01/10/2023   Procedure: ESOPHAGOGASTRODUODENOSCOPY (EGD);  Surgeon: Quentin Ore, MD;  Location: [facility];  Service: General;  Laterality: N/A;   LAPAROSCOPIC GASTRIC BANDING  2011    Family History  Problem Relation Age of Onset   Cancer Father        prostate    Social:  reports that he has never smoked. He has never used smokeless tobacco. He reports that he does not currently use alcohol. He reports that he does not use drugs.  Allergies: No Known Allergies  Medications: Current Outpatient Medications  Medication Instructions   escitalopram (LEXAPRO) 20 mg, Oral, Daily   fenofibrate micronized (LOFIBRA) 134 mg, Oral, Daily   lisinopril (ZESTRIL) 10 mg, Oral, Daily   metoprolol succinate (TOPROL-XL) 100 mg, Oral, Daily    ROS - all of the below systems have been reviewed with the patient and positives are indicated with bold text General: chills, fever or night sweats Eyes: blurry vision or double vision ENT: epistaxis or sore throat Allergy/Immunology: itchy/watery eyes or nasal congestion Hematologic/Lymphatic: bleeding problems, blood clots or swollen lymph nodes Endocrine: temperature intolerance or unexpected weight changes Breast: new or changing breast lumps or nipple discharge Resp: cough, shortness of breath, or wheezing CV: chest pain or dyspnea on  exertion GI: as per HPI GU: dysuria, trouble voiding, or hematuria MSK: joint pain or joint stiffness Neuro: TIA or stroke symptoms Derm: pruritus and skin lesion changes Psych: anxiety and depression  Objective   PE Blood pressure (!) 164/97, pulse 72, temperature 98.4 F (36.9 C), temperature source Oral, resp. rate 18, height 6' (1.829 m), weight (!) 195 kg, SpO2 98 %. Constitutional: NAD; conversant; no deformities Eyes: Moist conjunctiva; no lid lag; anicteric; PERRL Neck: Trachea midline; no thyromegaly Lungs: Normal respiratory effort; no tactile fremitus CV: RRR; no palpable thrills; no pitting edema GI: Abd Soft, nontender; no palpable hepatosplenomegaly MSK: Normal range of motion of extremities; no clubbing/cyanosis Psychiatric: Appropriate affect; alert and oriented x3 Lymphatic: No palpable cervical or axillary lymphadenopathy  No results found for this or any previous visit (from the past 24 hour(s)).  Imaging Orders  No imaging studies ordered today     Assessment and Plan   Manuel Wood is an 37 y.o. male with a lap band and morbid obesity.    Manuel Wood is a 37 y.o. male with a history of laparoscopic adjustable gastric band placement in 2011, who is seen for revision bariatric surgery consultation. The patient has morbid obesity with a BMI of Body mass index is 58 kg/m. and the following conditions related to obesity: Hypertension, obstructive sleep apnea.  We discussed the surgical options to treat obesity and its associated comorbidity. After discussing the available procedures in the region, we discussed in great detail the surgeries I offer for revision of lap band: robotic sleeve  gastrectomy and robotic roux-en-y gastric bypass. We discussed the procedures themselves as well as their risks, benefits and alternatives. I entered the patient's basic information into the [facility] Metabolic Surgery Risk/Benefit Calculator to facilitate this discussion.  After  a full discussion and all questions answered, the patient is interested in pursuing a robotic conversion of adjustable gastric band to gastric bypass with upper endoscopy.  He completed the bariatric surgery preoperative pathway to include the following: - Bloodwork - Dietician consult - Chest x-ray - EKG - Psychology evaluation - Upper endoscopy with biopsy  Today he presents for surgery.  We again discussed the procedure, its risks, benefits and alternatives and the patient granted consent to proceed.  We will proceed as scheduled.    ICD-10-CM   1. Preoperative testing  Z01.818 CANCELED: Pregnancy, urine STAT morning of surgery    CANCELED: Pregnancy, urine URGENT morning of surgery    CANCELED: Pregnancy, urine STAT morning of surgery    CANCELED: Pregnancy, urine URGENT morning of surgery    2. Preop testing  Z01.818 CBG per Guidelines for Diabetes Management for Patients Undergoing Surgery ([facility], [facility], and [facility] only)    CBG per Guidelines for Diabetes Management for Patients Undergoing Surgery ([facility], [facility], and [facility] only)       Quentin Ore, MD  [facility] Use [email or web address] to contact on call provider

## 2023-05-16 NOTE — Progress Notes (Signed)

## 2023-05-16 NOTE — Anesthesia Postprocedure Evaluation (Signed)
Anesthesia Post Note  Patient: Manuel Wood  Procedure(s) Performed: ROBOTIC LAP BAND REMOVAL XI ROBOT ASSISTED ROUX-EN-Y WITH UPPER ENDOSCOPY     Patient location during evaluation: PACU Anesthesia Type: General Level of consciousness: awake and alert Pain management: pain level controlled Vital Signs Assessment: post-procedure vital signs reviewed and stable Respiratory status: spontaneous breathing, nonlabored ventilation and respiratory function stable Cardiovascular status: blood pressure returned to baseline Postop Assessment: no apparent nausea or vomiting Anesthetic complications: no     Last Vitals:  Vitals:   05/16/23 1236 05/16/23 1255  BP: (!) 147/84 (!) 155/88  Pulse: 67 62  Resp: 20 18  Temp:  37 C  SpO2: 97% 98%    Last Pain:  Vitals:   05/16/23 1255  TempSrc: Oral  PainSc:                  Shanda Howells

## 2023-05-16 NOTE — Discharge Instructions (Signed)

## 2023-05-16 NOTE — Progress Notes (Signed)
   05/16/23 2100  BiPAP/CPAP/SIPAP  BiPAP/CPAP/SIPAP Pt Type Adult  BiPAP/CPAP/SIPAP Resmed  Mask Type Nasal mask (Pts. own.)  Respiratory Rate 18 breaths/min  FiO2 (%) 21 %  Flow Rate 0 lpm  Patient Home Equipment Yes (Own hose/nasal mask-ResMed adapter added from unit to pts. hose.)  Auto Titrate Yes (Start-18cmh20, Ramp from 10 over 20 min.)   Made aware to notify if needed, family members remaining at bedside.

## 2023-05-16 NOTE — Op Note (Signed)
Patient: Manuel Wood (1986-04-20, [id number])  Date of Surgery: 05/16/2023   Preoperative Diagnosis: MORBID OBESITY BMI 58, OBSTRUCTIVE SLEEP APNEA, HYPERTENSION, HISTORY OF LAP BAND PLACEMENT  Postoperative Diagnosis: MORBID OBESITY BMI 58, OBSTRUCTIVE SLEEP APNEA, HYPERTENSION, HISTORY OF LAP BAND PLACEMENT  Surgical Procedure: ROBOTIC LAP BAND REMOVAL:  XI ROBOT ASSISTED ROUX-EN-Y WITH UPPER ENDOSCOPY: 82956 (CPT)   Operative Team Members:  Surgeon(s) and Role:    * Croy Drumwright, Hyman Hopes, MD - Primary    * Kinsinger, De Blanch, MD - Assisting   Anesthesiologist: Kaylyn Layer, MD CRNA: Vanessa [place], CRNA; Epimenio Sarin, CRNA; Orest Dikes, CRNA   Anesthesia: General   Fluids:  Total I/O In: 2100 [I.V.:2000; IV Piggyback:100] Out: 175 [Urine:150; Blood:25]  Complications: (1) Difficult to intubate - expected  Comments: Filed from anesthesia note documentation.  Drains:  none   Specimen: None  Disposition:  PACU - hemodynamically stable.  Plan of Care: Admit to inpatient     Indications for Procedure:  Manuel Wood is a 37 y.o. male with a history of laparoscopic adjustable gastric band placement in 2011, who is seen for revision bariatric surgery consultation. The patient has morbid obesity with a BMI of Body mass index is 58 kg/m. and the following conditions related to obesity: Hypertension, obstructive sleep apnea.  We discussed the surgical options to treat obesity and its associated comorbidity. After discussing the available procedures in the region, we discussed in great detail the surgeries I offer for revision of lap band: robotic sleeve gastrectomy and robotic roux-en-y gastric bypass. We discussed the procedures themselves as well as their risks, benefits and alternatives. I entered the patient's basic information into the [facility] Metabolic Surgery Risk/Benefit Calculator to facilitate this discussion.  After a full discussion and all  questions answered, the patient is interested in pursuing a robotic conversion of adjustable gastric band to gastric bypass with upper endoscopy.  He completed the bariatric surgery preoperative pathway to include the following: - Bloodwork - Dietician consult - Chest x-ray - EKG - Psychology evaluation - Upper endoscopy with biopsy  Today he presents for surgery.  We again discussed the procedure, its risks, benefits and alternatives and the patient granted consent to proceed.  We will proceed as scheduled.     Findings: Lap band removed in total:   Infection status: Patient: Private Patient Elective Case Case: Elective Infection Present At Time Of Surgery (PATOS): Some spillage of foregut  and jejunal contents while creating anastomoses   Description of Procedure:   On the date stated above, the patient was taken to the operating room suite and placed in supine positioning.  General endotracheal anesthesia was induced.  A timeout was completed verifying the correct patient, procedure, positioning and equipment needed for the case.  The patient's abdomen was prepped and draped in the usual sterile fashion.  A 5 mm trocar was used to enter the right upper quadrant using optical technique.  The abdomen was entered safely without any trauma the underlying viscera.  Three additional incisions were made and 4 robotic trochars were placed across the abdomen, replacing the 5 mm trocar with the 12 mm robotic stapler trocar. The [facility] liver retractor was placed through the subxiphoid region and under the left lobe of the liver and was connected to the rail of the bed.  A TAP block was placed using marcaine and Exparel under direct vision of the laparoscope.    The lap band was dissected off the liver using laparoscopic  instruments.  The buckle was divided with scissors and the lap band was removed from its position around the stomach.  I divided the lap band tubing just inside the fascia.  The  lap band was removed through the 12 mm port site.    The [employer] XI robotic platform was docked and we transitioned to robotic surgery.   Using the tip up grasper, fenestrated bipolar, 30 degree camera and Vessel Sealer from the patient's right to left, we began by dissecting the angle of His off the left crus of the diaphragm.  I divided the cicatrix from the lap band to define the anatomy of the cardia of the stomach.   The adhesions between the stomach, spleen and diaphragm were divided using the Vessel Sealer to define the angle of His.  I then started 6 cm down on the lesser curve of the stomach and created a defect in the gastrohepatic ligament tracking behind the lesser curve of the stomach to enter the lesser sac.  I then used multiple blue loads of the robotic 60 mm Sureform linear stapler to form the gastric pouch.  I then directed my attention to the lower abdomen.  The omentum was divided with the Vessel Sealer and I identified the ligament of treitz.  The jejunum was run to a point 50 cm from the ligament of Treitz.  This loop of bowel was then brought into the left upper quadrant, over the transverse colon, between the split omentum.  A 2-0 v-loc suture was used to create the posterior outer row of the gastrojejunal anastomosis.  An approximately 2 cm gastrotomy was made in the pouch and a matching 2 cm enterotomy was created in the roux limb.  Then, two 3-0 v-loc sutures were used to create a posterior, inner, full thickness layer of the anastomosis.  While sewing the anterior inner layer, the Ewald tube was passed through the anastomosis to ensure patency.  The outer, anterior layer was then created using 2-0 v-loc suture.  A window was made in the jejunal mesentery and the jejunum was divided just proximal to the gastrojejunal anastomosis using a white load of the robotic 60 mm Sureform linear stapler to divide the roux limb from the hepatobiliary limb.  At this point the gastrojejunal  anastomosis was complete and the Ewald tube was removed.  The roux limb was then run 100 cm from the gastrojejunal anastomosis.  This loop of bowel was brought into the left upper quadrant for anastomosis to the hepatobiliary limb.  A robotic 60 mm white load on the Sureform linear stapler was introduced into both limbs and fired to create the jejunojejunostomy.  A second 60 mm white load of the Sureform linear stapler was fired to connect the distal Roux limb to the proximal common channel creating a W shaped anastomosis.  The common enterotomy was closed with a final 60 mm white load of the Sureform linear stapler.  The jejunojejunostomy mesenteric defect was closed using running 0-Ethibond suture.   The retro-roux defect was closed, closing the roux limb mesentery to the transverse mesocolon mesentery using a running 0-Ethibond suture.   I ran the roux limb from the gastrojejunal anastomosis to the jejunojejunostomy and found the anatomy as expected without any twisting of the mesentery.  The intra-abdominal pressure was decreased to for the remainder of the case to check for hemostasis.  I then transitioned to endoscopic portion of the procedure.  The adult upper endoscope was inserted into the pouch.  The pouch appeared appropriately sized.  There was good intra-luminal hemostasis.  The endoscope was inserted through the anastomosis into the roux limb.  The anastomosis appeared well formed without any stricture.  The anastomosis was submerged in irrigation in the left upper quadrant and there was no leakage of air bubbles with endoscopic insufflation suggesting a negative leak test and an air tight anastomosis.    The foregut was decompressed with the endoscope and the endoscope was removed.  The robotic instruments were removed and the robot was undocked.  The stapler port in the right abdomen was closed at the fascial level using 0-vicryl on a PMI suture passer.  The liver retractor was removed  under direct vision.  The pneumoperitoneum was evacuated.  The skin was closed using 4-0 Monocryl and Dermabond.  All sponge and needle counts were correct at the end of the case.    Ivar Drape, MD General, Bariatric, & Minimally Invasive Surgery [facility], [place]

## 2023-05-16 NOTE — Anesthesia Procedure Notes (Signed)
Procedure Name: Intubation Date/Time: 05/16/2023 7:36 AM  Performed by: Orest Dikes, CRNAPre-anesthesia Checklist: Patient identified, Emergency Drugs available, Suction available and Patient being monitored Patient Re-evaluated:Patient Re-evaluated prior to induction Oxygen Delivery Method: Circle system utilized Preoxygenation: Pre-oxygenation with 100% oxygen Induction Type: IV induction Ventilation: Oral airway inserted - appropriate to patient size and Two handed mask ventilation required Laryngoscope Size: Glidescope and 4 Grade View: Grade I Tube type: Oral Tube size: 7.5 mm Number of attempts: 1 Airway Equipment and Method: Stylet and Oral airway Placement Confirmation: ETT inserted through vocal cords under direct vision, positive ETCO2 and breath sounds checked- equal and bilateral Secured at: 26 cm Tube secured with: Tape Dental Injury: Teeth and Oropharynx as per pre-operative assessment  Difficulty Due To: Difficulty was anticipated, Difficult Airway- due to limited oral opening and Difficult Airway- due to large tongue Comments: Pre-oxygenated for several minutes prior to induction. Elective Glidescope intubation due to patient size and airway assessment. Required 2 handed mask with oral airway. DL x 1 with Glidescope 4 with Grade 1 view. ETT gently placed and placement confirmed by Dr Stephannie Lorin Gawron.  Recommend Glidescope use for future intubations.

## 2023-05-16 NOTE — TOC Benefit Eligibility Note (Signed)
Pharmacy Patient Advocate Encounter  Insurance verification completed.    The patient is insured through [employer]  Ran test claim for enoxaparin (Lovenox) 60 mg/0.6 ml and the current 14 day co-pay is $47.00.   This test claim was processed through [facility]- copay amounts may vary at other pharmacies due to pharmacy/plan contracts, or as the patient moves through the different stages of their insurance plan.    Roland Earl, CPHT Pharmacy Patient Advocate Specialist [facility] Patient Advocate Team Direct Number: [phone]  Fax: [phone]

## 2023-05-16 NOTE — Op Note (Signed)
Preoperative diagnosis: robotic removal of lap band and roux-en-y gastric byapss  Postoperative diagnosis: Same   Procedure: Upper endoscopy   Surgeon: Feliciana Rossetti, M.D.  Anesthesia: Gen.   Indications for procedure: This patient was undergoing a Roux-en-Y gastric bypass.   Description of procedure: The endoscopy was placed in the mouth and into the oropharynx and under endoscopic vision it was advanced to the esophagogastric junction.  The stomach was insufflated and no bleeding or bubbles were seen.  The GEJ was identified at 45 cm from the teeth. The anastomosis was widely patent at 51 cm for a 7 cm pouch. No bleeding or leaks were detected. The scope was withdrawn without difficulty.    Feliciana Rossetti, M.D. General, Bariatric, & Minimally Invasive Surgery [facility] [place]

## 2023-05-17 ENCOUNTER — Other Ambulatory Visit (HOSPITAL_COMMUNITY): Payer: Self-pay

## 2023-05-17 LAB — CBC WITH DIFFERENTIAL/PLATELET
Abs Immature Granulocytes: 0.09 10*3/uL — ABNORMAL HIGH (ref 0.00–0.07)
Basophils Absolute: 0 10*3/uL (ref 0.0–0.1)
Basophils Relative: 0 %
Eosinophils Absolute: 0 10*3/uL (ref 0.0–0.5)
Eosinophils Relative: 0 %
HCT: 41.1 % (ref 39.0–52.0)
Hemoglobin: 13.5 g/dL (ref 13.0–17.0)
Immature Granulocytes: 1 %
Lymphocytes Relative: 14 %
Lymphs Abs: 1.8 10*3/uL (ref 0.7–4.0)
MCH: 28.8 pg (ref 26.0–34.0)
MCHC: 32.8 g/dL (ref 30.0–36.0)
MCV: 87.6 fL (ref 80.0–100.0)
Monocytes Absolute: 1.2 10*3/uL — ABNORMAL HIGH (ref 0.1–1.0)
Monocytes Relative: 9 %
Neutro Abs: 10.2 10*3/uL — ABNORMAL HIGH (ref 1.7–7.7)
Neutrophils Relative %: 76 %
Platelets: 249 10*3/uL (ref 150–400)
RBC: 4.69 MIL/uL (ref 4.22–5.81)
RDW: 14.4 % (ref 11.5–15.5)
WBC: 13.4 10*3/uL — ABNORMAL HIGH (ref 4.0–10.5)
nRBC: 0 % (ref 0.0–0.2)

## 2023-05-17 MED ORDER — ENOXAPARIN (LOVENOX) PATIENT EDUCATION KIT
PACK | Freq: Once | Status: AC
  Filled 2023-05-17: qty 1

## 2023-05-17 MED ORDER — ENOXAPARIN (LOVENOX) PATIENT EDUCATION KIT
1.0000 | PACK | Freq: Once | 0 refills | Status: AC
  Filled 2023-05-17: qty 1, 1d supply, fill #0

## 2023-05-17 MED ORDER — ACETAMINOPHEN 500 MG PO TABS
1000.0000 mg | ORAL_TABLET | Freq: Three times a day (TID) | ORAL | 0 refills | Status: AC
Start: 2023-05-17 — End: 2023-05-22

## 2023-05-17 MED ORDER — PANTOPRAZOLE SODIUM 40 MG PO TBEC
40.0000 mg | DELAYED_RELEASE_TABLET | Freq: Every day | ORAL | 0 refills | Status: AC
Start: 2023-05-17 — End: ?
  Filled 2023-05-17: qty 90, 90d supply, fill #0

## 2023-05-17 MED ORDER — METHOCARBAMOL 750 MG PO TABS
750.0000 mg | ORAL_TABLET | Freq: Four times a day (QID) | ORAL | 0 refills | Status: DC | PRN
  Filled 2023-05-17: qty 30, 8d supply, fill #0

## 2023-05-17 MED ORDER — ENOXAPARIN SODIUM 60 MG/0.6ML IJ SOSY
60.0000 mg | PREFILLED_SYRINGE | Freq: Two times a day (BID) | INTRAMUSCULAR | 0 refills | Status: DC
  Filled 2023-05-17: qty 16.8, 14d supply, fill #0

## 2023-05-17 MED ORDER — GABAPENTIN 100 MG PO CAPS
200.0000 mg | ORAL_CAPSULE | Freq: Two times a day (BID) | ORAL | 0 refills | Status: DC
  Filled 2023-05-17: qty 20, 5d supply, fill #0

## 2023-05-17 MED ORDER — OXYCODONE HCL 5 MG PO TABS
5.0000 mg | ORAL_TABLET | Freq: Four times a day (QID) | ORAL | 0 refills | Status: DC | PRN
  Filled 2023-05-17: qty 10, 3d supply, fill #0

## 2023-05-17 MED ORDER — ONDANSETRON 4 MG PO TBDP
4.0000 mg | ORAL_TABLET | Freq: Four times a day (QID) | ORAL | 0 refills | Status: DC | PRN
  Filled 2023-05-17: qty 18, 5d supply, fill #0

## 2023-05-17 NOTE — Discharge Summary (Signed)
  Patient ID: Manuel Wood [id number] 36 y.o. [birth year]  05/16/2023  Discharge date and time: 05/17/2023  Admitting Physician: Hyman Hopes [name]  Discharge Physician: Hyman Hopes [name]  Admission Diagnoses: Morbid obesity with BMI of 50.0-59.9, adult (HCC) [E66.01, Z68.43] Patient Active Problem List   Diagnosis Date Noted   Morbid obesity with BMI of 50.0-59.9, adult (HCC) 05/16/2023   Atypical chest pain 12/27/2019   Essential hypertension 12/27/2019   Morbid obesity (HCC) 12/27/2019     Discharge Diagnoses:  Patient Active Problem List   Diagnosis Date Noted   Morbid obesity with BMI of 50.0-59.9, adult (HCC) 05/16/2023   Atypical chest pain 12/27/2019   Essential hypertension 12/27/2019   Morbid obesity (HCC) 12/27/2019    Operations: Procedure(s): ROBOTIC LAP BAND REMOVAL XI ROBOT ASSISTED ROUX-EN-Y WITH UPPER ENDOSCOPY  Admission Condition: good  Discharged Condition: good  Indication for Admission: Obesity  Hospital Course: Robotic removal of lap band with conversion to gastric bypass  Consults: None  Significant Diagnostic Studies: None  Treatments: surgery: as above  Disposition: Home  Patient Instructions:  Allergies as of 05/17/2023   No Known Allergies      Medication List     TAKE these medications    acetaminophen 500 MG tablet Commonly known as: TYLENOL Take 2 tablets (1,000 mg total) by mouth every 8 (eight) hours for 5 days.   enoxaparin 60 MG/0.6ML injection Commonly known as: LOVENOX Inject 0.6 mLs (60 mg total) into the skin every 12 (twelve) hours for 14 days.   enoxaparin Kit Commonly known as: LOVENOX 1 kit by Does not apply route once for 1 dose.   escitalopram 20 MG tablet Commonly known as: LEXAPRO Take 20 mg by mouth daily.   fenofibrate micronized 134 MG capsule Commonly known as: LOFIBRA Take 134 mg by mouth daily.   gabapentin 100 MG capsule Commonly known as: NEURONTIN Take 2 capsules (200 mg total) by  mouth every 12 (twelve) hours.   lisinopril 10 MG tablet Commonly known as: ZESTRIL Take 10 mg by mouth daily.   methocarbamol 750 MG tablet Commonly known as: Robaxin-750 Take 1 tablet (750 mg total) by mouth every 6 (six) hours as needed for muscle spasms.   metoprolol succinate 100 MG 24 hr tablet Commonly known as: TOPROL-XL Take 100 mg by mouth daily.   ondansetron 4 MG disintegrating tablet Commonly known as: ZOFRAN-ODT Take 1 tablet (4 mg total) by mouth every 6 (six) hours as needed for nausea or vomiting.   oxyCODONE 5 MG immediate release tablet Commonly known as: Oxy IR/ROXICODONE Take 1 tablet (5 mg total) by mouth every 6 (six) hours as needed for severe pain.   pantoprazole 40 MG tablet Commonly known as: PROTONIX Take 1 tablet (40 mg total) by mouth daily.        Activity: no heavy lifting for 4 weeks Diet:  Bariatric Wound Care: keep wound clean and dry  Follow-up:  With Dr. Dossie Der Signed: Hyman Hopes [name] General, Bariatric, & Minimally Invasive Surgery [facility] [place] [facility], [place]   05/17/2023, 5:47 PM

## 2023-05-17 NOTE — Progress Notes (Signed)
   05/17/23 1440  TOC Brief Assessment  Insurance and Status Reviewed  Patient has primary care physician Yes  Home environment has been reviewed home alone  Prior level of function: independent  Prior/Current Home Services No current home services  Social Determinants of Health Reivew SDOH reviewed no interventions necessary  Readmission risk has been reviewed Yes  Transition of care needs no transition of care needs at this time

## 2023-05-17 NOTE — Plan of Care (Signed)
  Problem: Education: Goal: Ability to state signs and symptoms to report to health care provider will improve Outcome: Adequate for Discharge Goal: Knowledge of the prescribed self-care regimen will improve Outcome: Adequate for Discharge Goal: Knowledge of discharge needs will improve Outcome: Adequate for Discharge   Problem: Activity: Goal: Ability to tolerate increased activity will improve Outcome: Adequate for Discharge   Problem: Bowel/Gastric: Goal: Gastrointestinal status for postoperative course will improve Outcome: Adequate for Discharge Goal: Occurrences of nausea will decrease Outcome: Adequate for Discharge   Problem: Coping: Goal: Development of coping mechanisms to deal with changes in body function or appearance will improve Outcome: Adequate for Discharge   Problem: Fluid Volume: Goal: Maintenance of adequate hydration will improve Outcome: Adequate for Discharge   Problem: Nutritional: Goal: Nutritional status will improve Outcome: Adequate for Discharge   Problem: Clinical Measurements: Goal: Will show no signs or symptoms of venous thromboembolism Outcome: Adequate for Discharge Goal: Will remain free from infection Outcome: Adequate for Discharge Goal: Will show no signs of GI Leak Outcome: Adequate for Discharge   Problem: Respiratory: Goal: Will regain and/or maintain adequate ventilation Outcome: Adequate for Discharge   Problem: Pain Management: Goal: Pain level will decrease Outcome: Adequate for Discharge   Problem: Skin Integrity: Goal: Demonstration of wound healing without infection will improve Outcome: Adequate for Discharge   Problem: Education: Goal: Knowledge of General Education information will improve Description: Including pain rating scale, medication(s)/side effects and non-pharmacologic comfort measures Outcome: Adequate for Discharge   Problem: Health Behavior/Discharge Planning: Goal: Ability to manage  health-related needs will improve Outcome: Adequate for Discharge   Problem: Clinical Measurements: Goal: Ability to maintain clinical measurements within normal limits will improve Outcome: Adequate for Discharge Goal: Will remain free from infection Outcome: Adequate for Discharge Goal: Diagnostic test results will improve Outcome: Adequate for Discharge Goal: Respiratory complications will improve Outcome: Adequate for Discharge Goal: Cardiovascular complication will be avoided Outcome: Adequate for Discharge   Problem: Activity: Goal: Risk for activity intolerance will decrease Outcome: Adequate for Discharge   Problem: Nutrition: Goal: Adequate nutrition will be maintained Outcome: Adequate for Discharge   Problem: Coping: Goal: Level of anxiety will decrease Outcome: Adequate for Discharge   Problem: Elimination: Goal: Will not experience complications related to bowel motility Outcome: Adequate for Discharge Goal: Will not experience complications related to urinary retention Outcome: Adequate for Discharge   Problem: Pain Managment: Goal: General experience of comfort will improve Outcome: Adequate for Discharge   Problem: Safety: Goal: Ability to remain free from injury will improve Outcome: Adequate for Discharge   Problem: Skin Integrity: Goal: Risk for impaired skin integrity will decrease Outcome: Adequate for Discharge   

## 2023-05-17 NOTE — Progress Notes (Signed)
Patient alert and oriented, pain is controlled. Patient is tolerating fluids, advanced to protein shake today, patient is tolerating well. Reviewed Gastric sleeve/bypass Discharge instructions with patient and patient is able to articulate understanding. Provided information on BELT program, Support Group, BSTOP-D, and [facility] outpatient pharmacy. Communicated general update of patient status to surgeon. All questions answered. 24hr fluid recall is 660 mL per hydration protocol, bariatric nurse coordinator to make follow-up phone call within one week.

## 2023-05-17 NOTE — Progress Notes (Signed)
Progress Note: General Surgery Service   Chief Complaint/Subjective: Pain with liquids.  Walking.  Pain medication lasts about 4 hours.  Objective: Vital signs in last 24 hours: Temp:  [97.5 F (36.4 C)-98.6 F (37 C)] 98.2 F (36.8 C) (07/10 0558) Pulse Rate:  [59-86] 72 (07/10 0558) Resp:  [15-20] 18 (07/10 0558) BP: (138-182)/(73-100) 149/92 (07/10 0558) SpO2:  [93 %-100 %] 99 % (07/10 0558) FiO2 (%):  [21 %] 21 % (07/09 2100) Last BM Date : 05/15/23  Intake/Output from previous day: 07/09 0701 - 07/10 0700 In: 4012.9 [P.O.:540; I.V.:3272.9; IV Piggyback:200] Out: 2825 [Urine:2800; Blood:25] Intake/Output this shift: Total I/O In: 210 [P.O.:60; I.V.:150] Out: -   GI: Abd Soft, incisions c/d/I w/ glue  Lab Results: CBC  Recent Labs    05/16/23 1216 05/16/23 1525 05/17/23 0500  WBC 13.3*  --  13.4*  HGB 13.7 14.6 13.5  HCT 42.0 44.6 41.1  PLT 269  --  249   BMET Recent Labs    05/16/23 1216  CREATININE 1.08   PT/INR No results for input(s): "LABPROT", "INR" in the last 72 hours. ABG No results for input(s): "PHART", "HCO3" in the last 72 hours.  Invalid input(s): "PCO2", "PO2"  Anti-infectives: Anti-infectives (From admission, onward)    Start     Dose/Rate Route Frequency Ordered Stop   05/16/23 0600  cefoTEtan (CEFOTAN) 2 g in sodium chloride 0.9 % 100 mL IVPB        2 g 200 mL/hr over 30 Minutes Intravenous On call to O.R. 05/16/23 0535 05/16/23 0740       Medications: Scheduled Meds:  acetaminophen  1,000 mg Oral Q8H   Or   acetaminophen (TYLENOL) oral liquid 160 mg/5 mL  1,000 mg Oral Q8H   escitalopram  20 mg Oral Daily   fenofibrate micronized  134 mg Oral Daily   heparin injection (subcutaneous)  5,000 Units Subcutaneous Q8H   lisinopril  10 mg Oral Daily   metoprolol succinate  100 mg Oral Daily   pantoprazole (PROTONIX) IV  40 mg Intravenous QHS   Ensure Max Protein  2 oz Oral Q2H   Continuous Infusions:  lactated ringers 75  mL/hr at 05/17/23 0209   PRN Meds:.hydrALAZINE, metoprolol tartrate, morphine injection, ondansetron (ZOFRAN) IV, oxyCODONE, simethicone  Assessment/Plan: s/p Procedure(s): ROBOTIC LAP BAND REMOVAL XI ROBOT ASSISTED ROUX-EN-Y WITH UPPER ENDOSCOPY 05/16/2023  Doing well POD1 Continue per protocol   LOS: 1 day    Quentin Ore, MD  [facility] Use [email or web address] to contact on call provider  Daily Billing: [id number] - post op

## 2023-05-22 ENCOUNTER — Other Ambulatory Visit (HOSPITAL_COMMUNITY): Payer: Self-pay

## 2023-05-22 ENCOUNTER — Telehealth (HOSPITAL_COMMUNITY): Payer: Self-pay | Admitting: *Deleted

## 2023-05-22 ENCOUNTER — Other Ambulatory Visit: Payer: Self-pay

## 2023-05-22 NOTE — Telephone Encounter (Signed)
1. Tell me about your pain and pain management?    Pt denies any pain.    2. Let's talk about fluid intake. How much total fluid are you taking in?   Pt states that s/he is getting in at least 64oz of fluid including protein shakes, and bottled water,   Pt instructed to assess status and suggestions daily utilizing Hydration Action Plan on discharge folder and to call [facility] if in the "red zone".     3. How much protein have you taken in the last day?    Pt states he is meeting his goal of 80g of protein each day with the protein shakes     4. Have you had nausea? Tell me about when you have experienced nausea and what you did to help?   Pt denies nausea.   5. Has the frequency or color changed with your urine?   Pt states that s/he is urinating "fine" with no changes in frequency or urgency.   6. Tell me what your incisions look like?   "Incisions look fine". Pt denies a fever, chills. Pt states incisions are not swollen, open, or draining. Pt encouraged to call [facility] if incisions change.     7. Have you been passing gas? BM?   Pt states that they are having BMs.   Pt states that they have had a BM. Pt instructed to take either Miralax or MoM as instructed per "Gastric Bypass/Sleeve Discharge Home Care Instructions". Pt to call surgeon's office if not able to have BM with medication.      8. If a problem or question were to arise who would you call? Do you know contact numbers for [facility], [facility], and [facility]?   Pt knows to call [facility] for surgical, [facility] for nutrition, and [facility] for non-urgent questions or concerns. Pt denies dehydration symptoms. Pt can describe s/sx of dehydration.   9. How has the walking going?   Pt states s/he is walking around and able to be active without difficulty.   10. Are you still using your incentive spirometer? If so, how often?   Pt states that he is doing the I.S. Pt encouraged to use incentive spirometer, at least 10x every hour while awake until s/he  sees the surgeon.   11. How are your vitamins and calcium going? How are you taking them?    Pt states that s/he is taking his supplements and vitamins without difficulty.    12. How has the anticoagulant Lovenox been going?   LOVENOX: Pt states that s/he is taking the Lovenox injections without difficulty. Reinforced education about taking injections q12h and rotating injection sites. Pt also instructed to monitor for unusual bruising and/or signs of bleeding.   Pt has reported his thighs have started itching and have red blotchy spots. He reported that he thinks it is related to his lovenox injections. Pt stated that he called [facility] and spoke with Tia Masker - who asked him to upload a picture in my chart. He was not sure how to do so. This RN, notified [facility] office of pt concern.

## 2023-05-30 ENCOUNTER — Encounter: Payer: Self-pay | Admitting: Dietician

## 2023-05-30 ENCOUNTER — Encounter: Payer: BC Managed Care – PPO | Attending: Surgery | Admitting: Dietician

## 2023-05-30 VITALS — Ht 72.0 in | Wt >= 6400 oz

## 2023-05-30 DIAGNOSIS — E669 Obesity, unspecified: Secondary | ICD-10-CM | POA: Diagnosis present

## 2023-05-30 NOTE — Progress Notes (Signed)
2 Week Post-Operative Nutrition Class   Patient was seen on 05/30/2023 for Post-Operative Nutrition education at the [facility].    Surgery date: 05/16/2023 Surgery type: RYGB (with band removal)  Anthropometrics  Start weight at [facility]: 447.5 lbs (date: 11/17/2022)  Height: 72 in Weight today: 403.3 lbs   Clinical  Medical hx: obesity, HTN, sleep apnea Medications: Lisinopril, escitalopram, metoprolol  Labs: no recent labs in EMR Notable signs/symptoms: none noted Any previous deficiencies? No Bowel Habits: Every day to every other day no complaints   Body Composition Scale 05/30/2023  Current Body Weight 403.3  Total Body Fat % 42.6  Visceral Fat 40  Fat-Free Mass % 57.3   Total Body Water % 38.3  Muscle-Mass lbs 76.2  BMI 54.9  Body Fat Displacement          Torso  lbs 106.8         Left Leg  lbs 21.3         Right Leg  lbs 21.3         Left Arm  lbs 10.6         Right Arm  lbs 10.6    The following the learning objectives were met by the patient during this course: Identifies Soft Prepped Plan Advancement Guide  Identifies Soft, High Proteins (Phase 1), beginning 2 weeks post-operatively to 3 weeks post-operatively Identifies Additional Soft High Proteins, soft non-starchy vegetables, fruits and starches (Phase 2), beginning 3 weeks post-operatively to 3 months post-operatively Identifies appropriate sources of fluids, proteins, vegetables, fruits and starches Identifies appropriate fat sources and healthy verses unhealthy fat types   States protein, vegetable, fruit and starch recommendations and appropriate sources post-operatively Identifies the need for appropriate texture modifications, mastication, and bite sizes when consuming solids Identifies appropriate fat consumption and sources Identifies appropriate multivitamin and calcium sources post-operatively Describes the need for physical activity post-operatively and will follow MD  recommendations States when to call healthcare provider regarding medication questions or post-operative complications   Handouts given during class include: Soft Prepped Plan Advancement Guide   Follow-Up Plan: Patient will follow-up at [facility] in 10 weeks for 3 month post-op nutrition visit for diet advancement per MD.

## 2023-06-08 ENCOUNTER — Telehealth: Payer: Self-pay | Admitting: Dietician

## 2023-06-08 NOTE — Telephone Encounter (Signed)
RD called pt to verify fluid intake once starting soft, solid proteins 2 week post-bariatric surgery.   Daily Fluid intake: 64 oz Daily Protein intake: 80 grams (still supplementing with protein shakes) Bowel Habits: daily, no issues  Concerns/issues: no issues

## 2023-08-14 ENCOUNTER — Ambulatory Visit: Payer: BC Managed Care – PPO | Admitting: Dietician

## 2023-09-04 ENCOUNTER — Ambulatory Visit: Payer: BC Managed Care – PPO | Admitting: Skilled Nursing Facility1

## 2024-02-22 ENCOUNTER — Other Ambulatory Visit: Payer: Self-pay | Admitting: Surgery

## 2024-02-22 DIAGNOSIS — L987 Excessive and redundant skin and subcutaneous tissue: Secondary | ICD-10-CM

## 2024-02-22 DIAGNOSIS — R229 Localized swelling, mass and lump, unspecified: Secondary | ICD-10-CM

## 2024-02-27 ENCOUNTER — Ambulatory Visit
Admission: RE | Admit: 2024-02-27 | Discharge: 2024-02-27 | Discharge status: Home / Self Care | Payer: Self-pay | Source: Ambulatory Visit | Attending: Surgery

## 2024-02-27 DIAGNOSIS — L987 Excessive and redundant skin and subcutaneous tissue: Secondary | ICD-10-CM

## 2024-02-27 DIAGNOSIS — R229 Localized swelling, mass and lump, unspecified: Secondary | ICD-10-CM

## 2024-02-27 MED ORDER — IOPAMIDOL (ISOVUE-370) INJECTION 76%
100.0000 mL | Freq: Once | INTRAVENOUS | Status: AC | PRN
  Administered 2024-02-27: 100 mL via INTRAVENOUS

## 2024-05-23 ENCOUNTER — Other Ambulatory Visit (HOSPITAL_COMMUNITY): Payer: Self-pay

## 2024-06-20 ENCOUNTER — Institutional Professional Consult (permissible substitution): Admitting: Plastic Surgery

## 2024-10-28 ENCOUNTER — Ambulatory Visit: Admitting: Plastic Surgery

## 2024-10-28 VITALS — BP 142/93 | HR 66 | Ht 72.0 in | Wt 283.0 lb

## 2024-10-28 DIAGNOSIS — E65 Localized adiposity: Secondary | ICD-10-CM | POA: Diagnosis not present

## 2024-10-28 DIAGNOSIS — M793 Panniculitis, unspecified: Secondary | ICD-10-CM

## 2024-10-28 DIAGNOSIS — Z9884 Bariatric surgery status: Secondary | ICD-10-CM

## 2024-10-28 DIAGNOSIS — R21 Rash and other nonspecific skin eruption: Secondary | ICD-10-CM

## 2024-10-28 NOTE — Progress Notes (Signed)
 "   Referring Provider Manuel Hun, MD [address] [place],  [place] [address]   CC:  Chief Complaint  Patient presents with   Advice Only   Skin Problem      Manuel Wood is an 38 y.o. male.  HPI: Mr. Manuel Wood is a 38 year old male who presents today for evaluation and management of excess skin on the anterior abdominal wall which often has rashes especially along the flanks.  He underwent bariatric surgery a year and a half ago and has lost 167 pounds since his surgery.  His total weight loss since his highest weight has been approximately 250 pounds.  His weight has been stable for the past year.  He is interested in having the excess skin removed.  Allergies[1]  Outpatient Encounter Medications as of 10/28/2024  Medication Sig   escitalopram  (LEXAPRO ) 20 MG tablet Take 20 mg by mouth daily.   metoprolol  succinate (TOPROL -XL) 50 MG 24 hr tablet Take 50 mg by mouth daily.   pantoprazole  (PROTONIX ) 40 MG tablet Take 1 tablet (40 mg total) by mouth daily.   [DISCONTINUED] enoxaparin  (LOVENOX ) 60 MG/0.6ML injection Inject 0.6 mLs (60 mg total) into the skin every 12 (twelve) hours for 14 days. (Patient not taking: Reported on 10/28/2024)   [DISCONTINUED] fenofibrate  micronized (LOFIBRA) 134 MG capsule Take 134 mg by mouth daily. (Patient not taking: Reported on 10/28/2024)   [DISCONTINUED] gabapentin  (NEURONTIN ) 100 MG capsule Take 2 capsules (200 mg total) by mouth every 12 (twelve) hours. (Patient not taking: Reported on 10/28/2024)   [DISCONTINUED] lisinopril  (ZESTRIL ) 10 MG tablet Take 10 mg by mouth daily. (Patient not taking: Reported on 10/28/2024)   [DISCONTINUED] methocarbamol  (ROBAXIN -750) 750 MG tablet Take 1 tablet (750 mg total) by mouth every 6 (six) hours as needed for muscle spasms. (Patient not taking: Reported on 10/28/2024)   [DISCONTINUED] metoprolol  succinate (TOPROL -XL) 100 MG 24 hr tablet Take 100 mg by mouth daily.   [DISCONTINUED] ondansetron   (ZOFRAN -ODT) 4 MG disintegrating tablet Take 1 tablet (4 mg total) by mouth every 6 (six) hours as needed for nausea or vomiting. (Patient not taking: Reported on 10/28/2024)   [DISCONTINUED] oxyCODONE  (OXY IR/ROXICODONE ) 5 MG immediate release tablet Take 1 tablet (5 mg total) by mouth every 6 (six) hours as needed for severe pain. (Patient not taking: Reported on 10/28/2024)   No facility-administered encounter medications on file as of 10/28/2024.     Past Medical History:  Diagnosis Date   Family history of adverse reaction to anesthesia    father- nausea   Hypertension    Sleep apnea    cpap    Past Surgical History:  Procedure Laterality Date   BIOPSY  01/10/2023   Procedure: BIOPSY;  Surgeon: Manuel Deward PARAS, MD;  Location: [facility];  Service: General;;   CHOLECYSTECTOMY     ESOPHAGOGASTRODUODENOSCOPY N/A 01/10/2023   Procedure: ESOPHAGOGASTRODUODENOSCOPY (EGD);  Surgeon: Manuel Deward PARAS, MD;  Location: [facility];  Service: General;  Laterality: N/A;   LAPAROSCOPIC GASTRIC BANDING  2011    Family History  Problem Relation Age of Onset   Cancer Father        prostate    Social History   Social History Narrative   Not on file     Review of Systems General: Denies fevers, chills, weight loss CV: Denies chest pain, shortness of breath, palpitations Abdomen: Excess skin and fat on the anterior abdominal wall  Physical Exam    10/28/2024    8:18 AM  05/30/2023    4:38 PM 05/17/2023    9:50 AM  Vitals with BMI  Height 6' 0 6' 0   Weight 283 lbs 403 lbs 5 oz   BMI 38.37 54.69   Systolic 142  160  Diastolic 93  98  Pulse 66  70    General:  No acute distress,  Alert and oriented, Non-Toxic, Normal speech and affect Abdomen: Patient has a moderate amount of skin on the anterior abdominal wall and on the flanks.  As with most men the pannus is tethered in the midline and does not allow it to fall to the symphysis pubis.  It does cover the inguinal  creases bilaterally.  He does have discoloration in the skin under the pannus consistent with previous infections.  This is documented in his photographs.   Assessment/Plan Panniculitis: Patient has a moderate amount of excess skin on the anterior abdominal wall and flanks.  I believe he would benefit from a panniculectomy for treatment of his rashes.  We discussed the procedure at length including the use of a midline incision to help remove some of the skin on the flanks.  We discussed the risks of bleeding, infection, and seroma formation.  He understands that with panniculectomy's the risks of wound separation and minor wound infections is much higher than with any other procedure.  He understands that he will need drains and compression postoperatively.  His postoperative limitations will include no heavy lifting greater than 20 pounds, no vigorous activity, no submerging the incisions in water for 6 weeks.  He will be encouraged to begin ambulation the day of surgery to help decrease the risk of DVT.  All questions were answered to his satisfaction.  Photographs were obtained today with his consent.  Will submit him for a panniculectomy at his request.  He will let me know if he would like to add the midline incision.  Manuel Wood 10/28/2024, 8:40 AM          [1] No Known Allergies  "

## 2024-12-13 ENCOUNTER — Ambulatory Visit: Admitting: Student

## 2024-12-13 VITALS — BP 156/92 | HR 81 | Wt 285.2 lb

## 2024-12-13 DIAGNOSIS — M793 Panniculitis, unspecified: Secondary | ICD-10-CM

## 2024-12-13 MED ORDER — OXYCODONE HCL 5 MG PO TABS: 5.0000 mg | ORAL_TABLET | Freq: Four times a day (QID) | ORAL | 0 refills | Status: AC | PRN

## 2024-12-13 MED ORDER — ONDANSETRON HCL 4 MG PO TABS: 4.0000 mg | ORAL_TABLET | Freq: Three times a day (TID) | ORAL | 0 refills | Status: AC | PRN

## 2024-12-13 NOTE — Progress Notes (Cosign Needed)
 "    Patient ID: Manuel Wood, male    DOB: 08/20/1986, 39 y.o.   MRN: [id number]  Chief Complaint  Patient presents with   Pre-op Exam    No diagnosis found.   History of Present Illness: Manuel Wood is a 39 y.o.  male  with a history of panniculitis.  He presents for preoperative evaluation for upcoming procedure, panniculectomy, scheduled for 01/07/2025 with Dr. Waddell.  Patient reports he was nauseous after anesthesia 1 time.  Otherwise denies any issues with anesthesia.  Patient denies any history of cardiac disease.  He denies seeing a cardiologist.  He states that after he had COVID, he had blood pressures issues and was seeing cardiology.  He denies any issues since then.  Patient reports he is not a smoker.  Patient denies taking any hormone replacement.  He denies any personal family history of blood clots or clotting diseases.  He denies any recent surgeries, traumas or infections.  He denies any history of stroke or heart attack.  He denies any history of Crohn's disease or ulcerative colitis, COPD or asthma.  He denies any history of cancer.  He denies any varicosities to his lower extremities.  He denies any recent fevers, chills or changes in his health.  Summary of Previous Visit: Patient was seen for initial consult by Dr. Waddell on 10/28/2024.  At this visit, patient reported that he had excess skin on the anterior abdominal wall which often had rashes along the flanks especially.  It was noted that patient underwent bariatric surgery about a year and a half prior and lost 167 pounds.  His total weight loss was about 250 pounds.  His weight had been stable for the past year.  Patient was interested in having excess skin removed.  Patient had a moderate amount of excess skin on the anterior abdominal wall and flanks.  It was noted that patient would benefit from a panniculectomy for the treatment of his rashes.  Job: Teacher, adult education principal, planning to take 1 week off.   Discussed with the patient that he will have restrictions for 6 weeks postoperatively.  He expressed understanding.  PMH Significant for: Hypertension, panniculitis  Patient's blood pressure is elevated in clinic today.  He denies any new headaches, changes in his vision, chest pain or shortness of breath.  Recommend PCP follow-up.  Past Medical History: Allergies: Allergies[1]  Current Medications: Current Medications[2]  Past Medical Problems: Past Medical History:  Diagnosis Date   Family history of adverse reaction to anesthesia    father- nausea   Hypertension    Sleep apnea    cpap    Past Surgical History: Past Surgical History:  Procedure Laterality Date   BIOPSY  01/10/2023   Procedure: BIOPSY;  Surgeon: Lyndel Deward PARAS, MD;  Location: [facility];  Service: General;;   CHOLECYSTECTOMY     ESOPHAGOGASTRODUODENOSCOPY N/A 01/10/2023   Procedure: ESOPHAGOGASTRODUODENOSCOPY (EGD);  Surgeon: Lyndel Deward PARAS, MD;  Location: [facility];  Service: General;  Laterality: N/A;   LAPAROSCOPIC GASTRIC BANDING  2011    Social History: Social History   Socioeconomic History   Marital status: Single    Spouse name: Not on file   Number of children: Not on file   Years of education: Not on file   Highest education level: Not on file  Occupational History   Not on file  Tobacco Use   Smoking status: Never   Smokeless tobacco: Never  Vaping Use   Vaping status:  Never Used  Substance and Sexual Activity   Alcohol use: Not Currently   Drug use: No   Sexual activity: Not on file  Other Topics Concern   Not on file  Social History Narrative   Not on file   Social Drivers of Health   Tobacco Use: Low Risk (02/26/2024)   Patient History    Smoking Tobacco Use: Never    Smokeless Tobacco Use: Never    Passive Exposure: Not on file  Financial Resource Strain: Not on file  Food Insecurity: No Food Insecurity (05/16/2023)   Hunger Vital Sign    Worried About  Running Out of Food in the Last Year: Never true    Ran Out of Food in the Last Year: Never true  Transportation Needs: No Transportation Needs (05/16/2023)   PRAPARE - Administrator, Civil Service (Medical): No    Lack of Transportation (Non-Medical): No  Physical Activity: Not on file  Stress: Not on file  Social Connections: Not on file  Intimate Partner Violence: Not At Risk (05/16/2023)   Humiliation, Afraid, Rape, and Kick questionnaire    Fear of Current or Ex-Partner: No    Emotionally Abused: No    Physically Abused: No    Sexually Abused: No  Depression (PHQ2-9): Low Risk (11/17/2022)   Depression (PHQ2-9)    PHQ-2 Score: 0  Alcohol Screen: Not on file  Housing: Unknown (02/22/2024)   Received from [facility]   Epic    Unable to Pay for Housing in the Last Year: Not on file    Number of Times Moved in the Last Year: Not on file    At any time in the past 12 months, were you homeless or living in a shelter (including now)?: No  Utilities: Not At Risk (05/16/2023)   [facility] Utilities    Threatened with loss of utilities: No  Health Literacy: Not on file    Family History: Family History  Problem Relation Age of Onset   Cancer Father        prostate    Review of Systems: Denies any recent fevers, chills or changes in his health  Physical Exam: Vital Signs BP (!) 156/92   Pulse 81   Wt 285 lb 3.2 oz (129.4 kg)   SpO2 98%   BMI 38.68 kg/m   Physical Exam  Constitutional:      General: Not in acute distress.    Appearance: Normal appearance. Not ill-appearing.  HENT:     Head: Normocephalic and atraumatic.  Eyes:     Pupils: Pupils are equal, round Cardiovascular:     Rate and Rhythm: Normal rate Pulmonary:     Effort: Pulmonary effort is normal. No respiratory distress.  Skin:    Findings: No erythema or rash.  Neurological:     Mental Status: Alert and oriented to person, place, and time. Mental status is at baseline.   Psychiatric:        Mood and Affect: Mood normal.        Behavior: Behavior normal.    Assessment/Plan: The patient is scheduled for panniculectomy with Dr. Waddell.  Risks, benefits, and alternatives of procedure discussed, questions answered and consent obtained.    Will send clearance to patient's PCP  Smoking Status: Non-smoker; Counseling Given?  N/A  Caprini Score: 4; Risk Factors include: Age, BMI > 25, and length of planned surgery. Recommendation for mechanical prophylaxis. Encourage early ambulation.   Pictures obtained: @consult   Post-op Rx  sent to pharmacy: Oxycodone , Zofran   Instructed the patient to hold any multivitamins, vitamins or supplements at least 1 week prior to surgery.  Patient expressed understanding.  Patient was provided with the General Surgical Risk consent document and Pain Medication Agreement prior to their appointment.  They had adequate time to read through the risk consent documents and Pain Medication Agreement. We also discussed them in person together during this preop appointment. All of their questions were answered to their satisfaction.  Recommended calling if they have any further questions.  Risk consent form and Pain Medication Agreement to be scanned into patient's chart.  The consent was obtained with risks and complications reviewed which included bleeding, pain, scar, infection and the risk of anesthesia.  The patients questions were answered to the patients expressed satisfaction.    Electronically signed by: Estefana FORBES Peck, PA-C 12/13/2024 3:27 PM      [1] No Known Allergies [2]  Current Outpatient Medications:    ondansetron  (ZOFRAN ) 4 MG tablet, Take 1 tablet (4 mg total) by mouth every 8 (eight) hours as needed for up to 15 doses for nausea or vomiting., Disp: 15 tablet, Rfl: 0   oxyCODONE  (ROXICODONE ) 5 MG immediate release tablet, Take 1 tablet (5 mg total) by mouth every 6 (six) hours as needed for up to 15 doses for severe  pain (pain score 7-10)., Disp: 15 tablet, Rfl: 0   escitalopram  (LEXAPRO ) 20 MG tablet, Take 20 mg by mouth daily., Disp: , Rfl:    metoprolol  succinate (TOPROL -XL) 50 MG 24 hr tablet, Take 50 mg by mouth daily., Disp: , Rfl:    pantoprazole  (PROTONIX ) 40 MG tablet, Take 1 tablet (40 mg total) by mouth daily., Disp: 90 tablet, Rfl: 0  "

## 2025-01-07 ENCOUNTER — Ambulatory Visit (HOSPITAL_BASED_OUTPATIENT_CLINIC_OR_DEPARTMENT_OTHER): Admit: 2025-01-07 | Admitting: Plastic Surgery

## 2025-01-07 ENCOUNTER — Encounter (HOSPITAL_BASED_OUTPATIENT_CLINIC_OR_DEPARTMENT_OTHER): Payer: Self-pay

## 2025-01-07 SURGERY — PANNICULECTOMY
Anesthesia: Choice

## 2025-01-13 ENCOUNTER — Encounter: Admitting: Plastic Surgery

## 2025-01-30 ENCOUNTER — Encounter: Admitting: Plastic Surgery
